# Patient Record
Sex: Female | Born: 1995 | Race: White | Hispanic: No | Marital: Single | Smoking: Current every day smoker
Health system: Southern US, Community
[De-identification: ages and names within clinical notes are randomized; demographics above are authoritative.]

## PROBLEM LIST (undated history)

## (undated) ENCOUNTER — Inpatient Hospital Stay (HOSPITAL_COMMUNITY): Payer: Self-pay

## (undated) DIAGNOSIS — R300 Dysuria: Secondary | ICD-10-CM

## (undated) DIAGNOSIS — Z789 Other specified health status: Secondary | ICD-10-CM

## (undated) DIAGNOSIS — R319 Hematuria, unspecified: Secondary | ICD-10-CM

## (undated) DIAGNOSIS — N898 Other specified noninflammatory disorders of vagina: Secondary | ICD-10-CM

## (undated) DIAGNOSIS — A749 Chlamydial infection, unspecified: Secondary | ICD-10-CM

## (undated) DIAGNOSIS — B019 Varicella without complication: Secondary | ICD-10-CM

## (undated) DIAGNOSIS — Z113 Encounter for screening for infections with a predominantly sexual mode of transmission: Secondary | ICD-10-CM

## (undated) DIAGNOSIS — IMO0002 Reserved for concepts with insufficient information to code with codable children: Secondary | ICD-10-CM

## (undated) DIAGNOSIS — F419 Anxiety disorder, unspecified: Secondary | ICD-10-CM

## (undated) DIAGNOSIS — R87629 Unspecified abnormal cytological findings in specimens from vagina: Secondary | ICD-10-CM

## (undated) HISTORY — PX: NO PAST SURGERIES: SHX2092

## (undated) HISTORY — DX: Dysuria: R30.0

## (undated) HISTORY — DX: Reserved for concepts with insufficient information to code with codable children: IMO0002

## (undated) HISTORY — DX: Unspecified abnormal cytological findings in specimens from vagina: R87.629

## (undated) HISTORY — DX: Hematuria, unspecified: R31.9

## (undated) HISTORY — DX: Varicella without complication: B01.9

## (undated) HISTORY — DX: Chlamydial infection, unspecified: A74.9

## (undated) HISTORY — DX: Anxiety disorder, unspecified: F41.9

## (undated) HISTORY — DX: Encounter for screening for infections with a predominantly sexual mode of transmission: Z11.3

## (undated) HISTORY — DX: Other specified noninflammatory disorders of vagina: N89.8

---
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
## 2017-03-06 ENCOUNTER — Encounter: Payer: Self-pay | Admitting: Advanced Practice Midwife

## 2017-03-06 ENCOUNTER — Ambulatory Visit (INDEPENDENT_AMBULATORY_CARE_PROVIDER_SITE_OTHER): Payer: Medicaid Other | Admitting: Advanced Practice Midwife

## 2017-03-06 VITALS — BP 99/78 | HR 82 | Ht 63.0 in | Wt 124.0 lb

## 2017-03-06 DIAGNOSIS — Z113 Encounter for screening for infections with a predominantly sexual mode of transmission: Secondary | ICD-10-CM

## 2017-03-06 NOTE — Progress Notes (Signed)
[facility] Visit  Patient name: Joyce Wilson MRN [id number]  Date of birth: 1995-11-18  CC & HPI:  Joyce Wilson is a 21 y.o. Caucasian female presenting today for STI testing.  Not exposed to anything, just wants to be safe.  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  Also wants therapy referral.  Has had mild depression before pregnancy, but feels like it's worse and wants to talk with a therapist . Declines SI/HI.   Pertinent History Reviewed:  Medical & Surgical Hx:   Past Medical History:  Diagnosis Date  . Burning with urination 02/01/2015  . Chicken pox   . Chlamydia infection   . Dyspareunia   . Hematuria 02/01/2015  . Screening for STD (sexually transmitted disease) 02/01/2015  . Vaginal irritation 02/01/2015   Past Surgical History:  Procedure Laterality Date  . NO PAST SURGERIES     Family History  Problem Relation Age of Onset  . Healthy Sister   . Diabetes Maternal Grandfather   . Hyperlipidemia Maternal Grandfather   . Heart disease Maternal Grandfather   . Hypertension Maternal Grandfather   . Healthy Mother   . Healthy Father   . Heart disease Maternal Uncle     Current Outpatient Prescriptions:  .  etonogestrel (NEXPLANON) 68 MG IMPL implant, 1 each by Subdermal route once., Disp: , Rfl:  Social History: Reviewed -  reports that she has quit smoking. Her smoking use included Cigarettes. She quit after 1.00 year of use. She has never used smokeless tobacco.  Review of Systems:   Constitutional: Negative for fever and chills Eyes: Negative for visual disturbances Respiratory: Negative for shortness of breath, dyspnea Cardiovascular: Negative for chest pain or palpitations  Gastrointestinal: Negative for vomiting, diarrhea and constipation; no abdominal pain Genitourinary: Negative for dysuria and urgency, vaginal irritation or itching Musculoskeletal: Negative for back pain, joint pain, myalgias  Neurological: Negative for dizziness and  headaches  Wants referral to therapist for depression. Wants to see a therapist   Objective Findings:    Physical Examination: General appearance - well appearing, and in no distress Mental status - alert, oriented to person, place, and time Chest:  Normal respiratory effort Heart - normal rate and regular rhythm Abdomen:  Soft, nontender Pelvic: deferred Musculoskeletal:  Normal range of motion without pain Extremities:  No edema    No results found for this or any previous visit (from the past 24 hour(s)).    Assessment & Plan:  A:   STI testing, depression P:  [facility]  [facility] form sent   Return for If you have any problems.  CRESENZO-DISHMAN,Sanjay Broadfoot CNM 03/06/2017 2:02 PM

## 2017-03-07 LAB — TRICHOMONAS VAGINALIS, PROBE AMP: TRICH VAG BY NAA: NEGATIVE

## 2017-03-08 LAB — GC/CHLAMYDIA PROBE AMP
CHLAMYDIA, DNA PROBE: NEGATIVE
NEISSERIA GONORRHOEAE BY PCR: NEGATIVE

## 2017-03-21 ENCOUNTER — Encounter: Payer: Self-pay | Admitting: Gastroenterology

## 2017-05-07 ENCOUNTER — Ambulatory Visit: Payer: Medicaid Other | Admitting: Gastroenterology

## 2017-11-28 ENCOUNTER — Encounter: Payer: Self-pay | Admitting: Women's Health

## 2017-11-28 ENCOUNTER — Ambulatory Visit (INDEPENDENT_AMBULATORY_CARE_PROVIDER_SITE_OTHER): Payer: Medicaid Other | Admitting: Women's Health

## 2017-11-28 ENCOUNTER — Other Ambulatory Visit: Payer: Self-pay

## 2017-11-28 VITALS — BP 118/70 | HR 93 | Ht 63.0 in | Wt 116.0 lb

## 2017-11-28 DIAGNOSIS — N921 Excessive and frequent menstruation with irregular cycle: Secondary | ICD-10-CM

## 2017-11-28 DIAGNOSIS — N898 Other specified noninflammatory disorders of vagina: Secondary | ICD-10-CM | POA: Diagnosis not present

## 2017-11-28 DIAGNOSIS — Z978 Presence of other specified devices: Secondary | ICD-10-CM

## 2017-11-28 DIAGNOSIS — L292 Pruritus vulvae: Secondary | ICD-10-CM

## 2017-11-28 DIAGNOSIS — Z975 Presence of (intrauterine) contraceptive device: Secondary | ICD-10-CM

## 2017-11-28 NOTE — Progress Notes (Signed)
   GYN VISIT Patient name: Joyce Wilson MRN [id number]  Date of birth: 1996/07/23 Chief Complaint:   Vaginal Itching (and burning)  History of Present Illness:   Joyce Wilson is a 22 y.o. G73P1001 Caucasian female being seen today for report of vulvovaginal itching and burning x 2 weeks. Had odor at first, now doesn't.  Nexplanon inserted 04/11/16, has been bleeding almost daily since, thinking about having it removed.      No LMP recorded. (Menstrual status: Irregular Periods). The current method of family planning is nexplanon Last pap <21yo. Results were:  n/a Review of Systems:   Pertinent items are noted in HPI Denies fever/chills, dizziness, headaches, visual disturbances, fatigue, shortness of breath, chest pain, abdominal pain, vomiting, abnormal vaginal discharge/itching/odor/irritation, problems with periods, bowel movements, urination, or intercourse unless otherwise stated above.  Pertinent History Reviewed:  Reviewed past medical,surgical, social, obstetrical and family history.  Reviewed problem list, medications and allergies. Physical Assessment:   Vitals:   11/28/17 1110  BP: 118/70  Pulse: 93  Weight: 116 lb (52.6 kg)  Height:  (1.6 m)  Body mass index is 20.55 kg/m.       Physical Examination:   General appearance: alert, well appearing, and in no distress  Mental status: alert, oriented to person, place, and time  Skin: warm & dry   Cardiovascular: normal heart rate noted  Respiratory: normal respiratory effort, no distress  Abdomen: soft, non-tender   Pelvic: VULVA: normal appearing vulva with no masses, tenderness or lesions, VAGINA: normal appearing vagina with normal color and scant discharge, no lesions, CERVIX: normal appearing cervix without discharge or lesions  Extremities: no edema   No results found for this or any previous visit (from the past 24 hour(s)).  Assessment & Plan:  1) Vulvovaginal itching/burning> send nuswab plus  2) BTB w/  Nexplanon> discussed if has vag infection could be from that, if not can try megace  Meds: No orders of the defined types were placed in this encounter.   Orders Placed This Encounter  Procedures  . NuSwab Vaginitis Plus (VG+)    Return for after 3/14 for , Pap & physical.  Cheral Marker CNM, [facility] 11/28/2017 11:32 AM

## 2017-12-02 ENCOUNTER — Other Ambulatory Visit: Payer: Self-pay | Admitting: Women's Health

## 2017-12-02 ENCOUNTER — Encounter: Payer: Self-pay | Admitting: Women's Health

## 2017-12-02 LAB — NUSWAB VAGINITIS PLUS (VG+)
CANDIDA ALBICANS, NAA: NEGATIVE
CHLAMYDIA TRACHOMATIS, NAA: NEGATIVE
Candida glabrata, NAA: NEGATIVE
NEISSERIA GONORRHOEAE, NAA: NEGATIVE
TRICH VAG BY NAA: NEGATIVE

## 2017-12-02 MED ORDER — MEGESTROL ACETATE 40 MG PO TABS: ORAL_TABLET | ORAL | 1 refills | Status: DC

## 2018-04-18 ENCOUNTER — Other Ambulatory Visit: Payer: Self-pay | Admitting: Women's Health

## 2018-04-18 MED ORDER — MEGESTROL ACETATE 40 MG PO TABS: ORAL_TABLET | ORAL | 1 refills | Status: DC

## 2018-12-31 ENCOUNTER — Encounter: Payer: Self-pay | Admitting: *Deleted

## 2019-01-01 ENCOUNTER — Encounter: Payer: Self-pay | Admitting: Advanced Practice Midwife

## 2019-01-01 ENCOUNTER — Ambulatory Visit (INDEPENDENT_AMBULATORY_CARE_PROVIDER_SITE_OTHER): Payer: Medicaid Other | Admitting: Advanced Practice Midwife

## 2019-01-01 ENCOUNTER — Other Ambulatory Visit: Payer: Self-pay

## 2019-01-01 VITALS — BP 118/79 | HR 101 | Ht 63.0 in | Wt 129.0 lb

## 2019-01-01 DIAGNOSIS — N898 Other specified noninflammatory disorders of vagina: Secondary | ICD-10-CM

## 2019-01-01 DIAGNOSIS — Z3046 Encounter for surveillance of implantable subdermal contraceptive: Secondary | ICD-10-CM | POA: Diagnosis not present

## 2019-01-01 NOTE — Addendum Note (Signed)
Addended by: Federico Flake A on: 01/01/2019 01:36 PM   Modules accepted: Orders

## 2019-01-01 NOTE — Progress Notes (Signed)
HPI:  Joyce Wilson 23 y.o. here for Nexplanon removal.  Her future plans for birth control are nuva ring. She has been having to wear tampons a lot d/t BTB.  C/O some mild vaginal itch/irritation. Wants to be checked for STDs.   Past Medical History: Past Medical History:  Diagnosis Date  . Burning with urination 02/01/2015  . Chicken pox   . Chlamydia infection   . Dyspareunia   . Hematuria 02/01/2015  . Screening for STD (sexually transmitted disease) 02/01/2015  . Vaginal irritation 02/01/2015    Past Surgical History: Past Surgical History:  Procedure Laterality Date  . NO PAST SURGERIES      Family History: Family History  Problem Relation Age of Onset  . Healthy Sister   . Diabetes Maternal Grandfather   . Hyperlipidemia Maternal Grandfather   . Heart disease Maternal Grandfather   . Hypertension Maternal Grandfather   . Healthy Mother   . Healthy Father   . Heart disease Maternal Uncle     Social History: Social History   Tobacco Use  . Smoking status: Former Smoker    Years: 1.00    Types: Cigarettes  . Smokeless tobacco: Never Used  Substance Use Topics  . Alcohol use: No  . Drug use: No    Allergies:  Allergies  Allergen Reactions  . Penicillins     Has patient had a PCN reaction causing immediate rash, facial/tongue/throat swelling, SOB or lightheadedness with hypotension: unknown Has patient had a PCN reaction causing severe rash involving mucus membranes or skin necrosis: unknown Has patient had a PCN reaction that required hospitalization: no Has patient had a PCN reaction occurring within the last 10 years: no If all of the above answers are "NO", then may proceed with Cephalosporin use. Pt does not know what reaction she had     Meds: (Not in a hospital admission)     Patient given informed consent for removal of her Nexplanon, time out was performed.  Signed copy in the chart.  Appropriate time out taken. Implanon site identified.  Area  prepped in usual sterile fashon. One cc of 1% lidocaine was used to anesthetize the area at the distal end of the implant. A small stab incision was made right beside the implant on the distal portion.  The Nexplanon rod was grasped using hemostats and removed without difficulty.  There was less than 3 cc blood loss. There were no complications.  A small amount of antibiotic ointment and steri-strips were applied over the small incision.  A pressure bandage was applied to reduce any bruising.  The patient tolerated the procedure well and was given post procedure instructions.    Pelvic exam: normal external genitalia, vulva, vagina, cervix, uterus and adnexa.  Discharge appears normal w/o odor  Nuswab collected.   A/P: Nexplanon out:  Use condoms/abstinence for next month Put NR in today, remove on 25th.  Continue In on 1st, out on 25th.  Will treat if infection on Nuswab Decrease tampon use.

## 2019-01-10 LAB — NUSWAB VAGINITIS PLUS (VG+)
Candida albicans, NAA: NEGATIVE
Candida glabrata, NAA: NEGATIVE
Chlamydia trachomatis, NAA: NEGATIVE
Neisseria gonorrhoeae, NAA: NEGATIVE
Trich vag by NAA: NEGATIVE

## 2019-03-31 ENCOUNTER — Encounter: Payer: Self-pay | Admitting: *Deleted

## 2019-03-31 ENCOUNTER — Other Ambulatory Visit: Payer: Self-pay | Admitting: Obstetrics and Gynecology

## 2019-03-31 DIAGNOSIS — O3680X0 Pregnancy with inconclusive fetal viability, not applicable or unspecified: Secondary | ICD-10-CM

## 2019-03-31 NOTE — Telephone Encounter (Signed)
Pt called and states her LMP was 01/22/2019.

## 2019-04-01 ENCOUNTER — Other Ambulatory Visit: Payer: Self-pay

## 2019-04-01 ENCOUNTER — Ambulatory Visit (INDEPENDENT_AMBULATORY_CARE_PROVIDER_SITE_OTHER): Payer: Medicaid Other

## 2019-04-01 DIAGNOSIS — Z3A09 9 weeks gestation of pregnancy: Secondary | ICD-10-CM

## 2019-04-01 DIAGNOSIS — O3680X0 Pregnancy with inconclusive fetal viability, not applicable or unspecified: Secondary | ICD-10-CM

## 2019-04-01 NOTE — Progress Notes (Signed)
[name] 5+2 wks,GS w/ys,no fetal pole,normal ovaries blat,pt will come back for f/u ultrasound in 10 days

## 2019-04-10 ENCOUNTER — Other Ambulatory Visit: Payer: Self-pay | Admitting: Obstetrics & Gynecology

## 2019-04-10 DIAGNOSIS — O3680X0 Pregnancy with inconclusive fetal viability, not applicable or unspecified: Secondary | ICD-10-CM

## 2019-04-13 ENCOUNTER — Ambulatory Visit (INDEPENDENT_AMBULATORY_CARE_PROVIDER_SITE_OTHER): Payer: Medicaid Other

## 2019-04-13 ENCOUNTER — Other Ambulatory Visit: Payer: Self-pay

## 2019-04-13 DIAGNOSIS — Z3A11 11 weeks gestation of pregnancy: Secondary | ICD-10-CM

## 2019-04-13 DIAGNOSIS — O3680X0 Pregnancy with inconclusive fetal viability, not applicable or unspecified: Secondary | ICD-10-CM

## 2019-04-13 NOTE — Progress Notes (Signed)
[name] 6+3 wks,single IUP w/ys,positive fht 116 bpm,normal ovaries bilat,crl 6.04 mm

## 2019-05-22 ENCOUNTER — Other Ambulatory Visit: Payer: Self-pay | Admitting: Obstetrics and Gynecology

## 2019-05-22 DIAGNOSIS — Z3682 Encounter for antenatal screening for nuchal translucency: Secondary | ICD-10-CM

## 2019-05-25 ENCOUNTER — Ambulatory Visit (INDEPENDENT_AMBULATORY_CARE_PROVIDER_SITE_OTHER): Payer: Medicaid Other | Admitting: Women's Health

## 2019-05-25 ENCOUNTER — Other Ambulatory Visit (HOSPITAL_COMMUNITY)
Admission: RE | Admit: 2019-05-25 | Discharge: 2019-05-25 | Disposition: A | Discharge status: Home / Self Care | Payer: Medicaid Other | Source: Ambulatory Visit | Attending: Obstetrics & Gynecology | Admitting: Obstetrics & Gynecology

## 2019-05-25 ENCOUNTER — Ambulatory Visit (INDEPENDENT_AMBULATORY_CARE_PROVIDER_SITE_OTHER): Payer: Medicaid Other

## 2019-05-25 ENCOUNTER — Other Ambulatory Visit: Payer: Self-pay

## 2019-05-25 ENCOUNTER — Encounter: Payer: Self-pay | Admitting: Women's Health

## 2019-05-25 ENCOUNTER — Ambulatory Visit: Payer: Medicaid Other | Admitting: *Deleted

## 2019-05-25 VITALS — BP 120/81 | HR 97 | Wt 131.0 lb

## 2019-05-25 DIAGNOSIS — Z331 Pregnant state, incidental: Secondary | ICD-10-CM

## 2019-05-25 DIAGNOSIS — Z36 Encounter for antenatal screening for chromosomal anomalies: Secondary | ICD-10-CM

## 2019-05-25 DIAGNOSIS — Z23 Encounter for immunization: Secondary | ICD-10-CM

## 2019-05-25 DIAGNOSIS — Z1379 Encounter for other screening for genetic and chromosomal anomalies: Secondary | ICD-10-CM

## 2019-05-25 DIAGNOSIS — Z124 Encounter for screening for malignant neoplasm of cervix: Secondary | ICD-10-CM | POA: Diagnosis not present

## 2019-05-25 DIAGNOSIS — Z3682 Encounter for antenatal screening for nuchal translucency: Secondary | ICD-10-CM | POA: Diagnosis not present

## 2019-05-25 DIAGNOSIS — Z130 Encounter for screening for diseases of the blood and blood-forming organs and certain disorders involving the immune mechanism: Secondary | ICD-10-CM

## 2019-05-25 DIAGNOSIS — Z0283 Encounter for blood-alcohol and blood-drug test: Secondary | ICD-10-CM

## 2019-05-25 DIAGNOSIS — Z3481 Encounter for supervision of other normal pregnancy, first trimester: Secondary | ICD-10-CM | POA: Insufficient documentation

## 2019-05-25 DIAGNOSIS — Z3A12 12 weeks gestation of pregnancy: Secondary | ICD-10-CM | POA: Diagnosis present

## 2019-05-25 DIAGNOSIS — R439 Unspecified disturbances of smell and taste: Secondary | ICD-10-CM

## 2019-05-25 DIAGNOSIS — Z1389 Encounter for screening for other disorder: Secondary | ICD-10-CM

## 2019-05-25 DIAGNOSIS — Z363 Encounter for antenatal screening for malformations: Secondary | ICD-10-CM

## 2019-05-25 DIAGNOSIS — Z3490 Encounter for supervision of normal pregnancy, unspecified, unspecified trimester: Secondary | ICD-10-CM | POA: Insufficient documentation

## 2019-05-25 DIAGNOSIS — Z1371 Encounter for nonprocreative screening for genetic disease carrier status: Secondary | ICD-10-CM

## 2019-05-25 LAB — POCT URINALYSIS DIPSTICK OB
Blood, UA: NEGATIVE
Glucose, UA: NEGATIVE
Ketones, UA: NEGATIVE
Leukocytes, UA: NEGATIVE
Nitrite, UA: NEGATIVE
POC,PROTEIN,UA: NEGATIVE

## 2019-05-25 MED ORDER — BLOOD PRESSURE MONITOR MISC: 0 refills | Status: DC

## 2019-05-25 MED ORDER — DOXYLAMINE-PYRIDOXINE 10-10 MG PO TBEC: DELAYED_RELEASE_TABLET | ORAL | 6 refills | Status: DC

## 2019-05-25 NOTE — Patient Instructions (Addendum)
Joyce Wilson, I greatly value your feedback.  If you receive a survey following your visit with [name] today, we appreciate you taking the time to fill it out.  Thanks, Joyce Wilson, CNM, [facility]  [facility] HOSPITAL HAS MOVED!!! It is now [facility] ([address], [place] [address]) Entrance located off of [address] Free 24/7 valet parking   Nausea & Vomiting  Have saltine crackers or pretzels by your bed and eat a few bites before you raise your head out of bed in the morning  Eat small frequent meals throughout the day instead of large meals  Drink plenty of fluids throughout the day to stay hydrated, just don't drink a lot of fluids with your meals.  This can make your stomach fill up faster making you feel sick  Do not brush your teeth right after you eat  Products with real ginger are good for nausea, like ginger ale and ginger hard candy Make sure it says made with real ginger!  Sucking on sour candy like lemon heads is also good for nausea  If your prenatal vitamins make you nauseated, take them at night so you will sleep through the nausea  Sea Bands  If you feel like you need medicine for the nausea & vomiting please let us know  If you are unable to keep any fluids or food down please let us know   Constipation  Drink plenty of fluid, preferably water, throughout the day  Eat foods high in fiber such as fruits, vegetables, and grains  Exercise, such as walking, is a good way to keep your bowels regular  Drink warm fluids, especially warm prune juice, or decaf coffee  Eat a 1/2 cup of real oatmeal (not instant), 1/2 cup applesauce, and 1/2-1 cup warm prune juice every day  If needed, you may take Colace (docusate sodium) stool softener once or twice a day to help keep the stool soft.   If you still are having problems with constipation, you may take Miralax once daily as needed to help keep your bowels regular.   For  Headaches:   Stay well hydrated, drink enough water so that your urine is clear, sometimes if you are dehydrated you can get headaches  Eat small frequent meals and snacks, sometimes if you are hungry you can get headaches  Sometimes you get headaches during pregnancy from the pregnancy hormones  You can try tylenol (1-2 regular strength  or 1-2 extra strength ) as directed on the box. The least amount of medication that works is best.   Cool compresses (cool wet washcloth or ice pack) to area of head that is hurting  You can also try drinking a caffeinated drink to see if this will help  If not helping, try below:  For Prevention of Headaches/Migraines:  CoQ10  three times daily  Vitamin B2  daily  Magnesium Oxide 400-600mg  daily  If You Get a Bad Headache/Migraine:  Benadryl    Magnesium Oxide  1 large Gatorade  2 extra strength Tylenol (1,000mg  total)  1 cup coffee or Coke  If this doesn't help please call us @ [phone]    Home Blood Pressure Monitoring for Patients   Your provider has recommended that you check your blood pressure (BP) at least once a week at home. If you do not have a blood pressure cuff at home, one will be provided for you. Contact your provider if you have not received  your monitor within 1 week.   Helpful Tips for Accurate Home Blood Pressure Checks   Don't smoke, exercise, or drink caffeine 30 minutes before checking your BP  Use the restroom before checking your BP (a full bladder can raise your pressure)  Relax in a comfortable upright chair  Feet on the ground  Left arm resting comfortably on a flat surface at the level of your heart  Legs uncrossed  Back supported  Sit quietly and don't talk  Place the cuff on your bare arm  Adjust snuggly, so that only two fingertips can fit between your skin and the top of the cuff  Check 2 readings separated by at least one minute  Keep a log of your BP  readings  For a visual, please reference this diagram: http://ccnc.care/bpdiagram  Provider Name: [facility]     Phone: [phone]  Zone 1: ALL CLEAR  Continue to monitor your symptoms:   BP reading is less than 140 (top number) or less than 90 (bottom number)   No right upper stomach pain  No headaches or seeing spots  No feeling nauseated or throwing up  No swelling in face and hands  Zone 2: CAUTION Call your doctor's office for any of the following:   BP reading is greater than 140 (top number) or greater than 90 (bottom number)   Stomach pain under your ribs in the middle or right side  Headaches or seeing spots  Feeling nauseated or throwing up  Swelling in face and hands  Zone 3: EMERGENCY  Seek immediate medical care if you have any of the following:   BP reading is greater than160 (top number) or greater than 110 (bottom number)  Severe headaches not improving with Tylenol  Serious difficulty catching your breath  Any worsening symptoms from Zone 2    First Trimester of Pregnancy The first trimester of pregnancy is from week 1 until the end of week 12 (months 1 through 3). A week after a sperm fertilizes an egg, the egg will implant on the wall of the uterus. This embryo will begin to develop into a baby. Genes from you and your partner are forming the baby. The female genes determine whether the baby is a boy or a girl. At 6-8 weeks, the eyes and face are formed, and the heartbeat can be seen on ultrasound. At the end of 12 weeks, all the baby's organs are formed.  Now that you are pregnant, you will want to do everything you can to have a healthy baby. Two of the most important things are to get good prenatal care and to follow your health care provider's instructions. Prenatal care is all the medical care you receive before the baby's birth. This care will help prevent, find, and treat any problems during the pregnancy and childbirth. BODY  CHANGES Your body goes through many changes during pregnancy. The changes vary from woman to woman.   You may gain or lose a couple of pounds at first.  You may feel sick to your stomach (nauseous) and throw up (vomit). If the vomiting is uncontrollable, call your health care provider.  You may tire easily.  You may develop headaches that can be relieved by medicines approved by your health care provider.  You may urinate more often. Painful urination may mean you have a bladder infection.  You may develop heartburn as a result of your pregnancy.  You may develop constipation because certain hormones are causing the muscles  that push waste through your intestines to slow down.  You may develop hemorrhoids or swollen, bulging veins (varicose veins).  Your breasts may begin to grow larger and become tender. Your nipples may stick out more, and the tissue that surrounds them (areola) may become darker.  Your gums may bleed and may be sensitive to brushing and flossing.  Dark spots or blotches (chloasma, mask of pregnancy) may develop on your face. This will likely fade after the baby is born.  Your menstrual periods will stop.  You may have a loss of appetite.  You may develop cravings for certain kinds of food.  You may have changes in your emotions from day to day, such as being excited to be pregnant or being concerned that something may go wrong with the pregnancy and baby.  You may have more vivid and strange dreams.  You may have changes in your hair. These can include thickening of your hair, rapid growth, and changes in texture. Some women also have hair loss during or after pregnancy, or hair that feels dry or thin. Your hair will most likely return to normal after your baby is born. WHAT TO EXPECT AT YOUR PRENATAL VISITS During a routine prenatal visit:  You will be weighed to make sure you and the baby are growing normally.  Your blood pressure will be taken.  Your  abdomen will be measured to track your baby's growth.  The fetal heartbeat will be listened to starting around week 10 or 12 of your pregnancy.  Test results from any previous visits will be discussed. Your health care provider may ask you:  How you are feeling.  If you are feeling the baby move.  If you have had any abnormal symptoms, such as leaking fluid, bleeding, severe headaches, or abdominal cramping.  If you have any questions. Other tests that may be performed during your first trimester include:  Blood tests to find your blood type and to check for the presence of any previous infections. They will also be used to check for low iron levels (anemia) and Rh antibodies. Later in the pregnancy, blood tests for diabetes will be done along with other tests if problems develop.  Urine tests to check for infections, diabetes, or protein in the urine.  An ultrasound to confirm the proper growth and development of the baby.  An amniocentesis to check for possible genetic problems.  Fetal screens for spina bifida and Down syndrome.  You may need other tests to make sure you and the baby are doing well. HOME CARE INSTRUCTIONS  Medicines  Follow your health care provider's instructions regarding medicine use. Specific medicines may be either safe or unsafe to take during pregnancy.  Take your prenatal vitamins as directed.  If you develop constipation, try taking a stool softener if your health care provider approves. Diet  Eat regular, well-balanced meals. Choose a variety of foods, such as meat or vegetable-based protein, fish, milk and low-fat dairy products, vegetables, fruits, and whole grain breads and cereals. Your health care provider will help you determine the amount of weight gain that is right for you.  Avoid raw meat and uncooked cheese. These carry germs that can cause birth defects in the baby.  Eating four or five small meals rather than three large meals a day  may help relieve nausea and vomiting. If you start to feel nauseous, eating a few soda crackers can be helpful. Drinking liquids between meals instead of during meals also seems  to help nausea and vomiting.  If you develop constipation, eat more high-fiber foods, such as fresh vegetables or fruit and whole grains. Drink enough fluids to keep your urine clear or pale yellow. Activity and Exercise  Exercise only as directed by your health care provider. Exercising will help you:  Control your weight.  Stay in shape.  Be prepared for labor and delivery.  Experiencing pain or cramping in the lower abdomen or low back is a good sign that you should stop exercising. Check with your health care provider before continuing normal exercises.  Try to avoid standing for long periods of time. Move your legs often if you must stand in one place for a long time.  Avoid heavy lifting.  Wear low-heeled shoes, and practice good posture.  You may continue to have sex unless your health care provider directs you otherwise. Relief of Pain or Discomfort  Wear a good support bra for breast tenderness.    Take warm sitz baths to soothe any pain or discomfort caused by hemorrhoids. Use hemorrhoid cream if your health care provider approves.    Rest with your legs elevated if you have leg cramps or low back pain.  If you develop varicose veins in your legs, wear support hose. Elevate your feet for 15 minutes, 3-4 times a day. Limit salt in your diet. Prenatal Care  Schedule your prenatal visits by the twelfth week of pregnancy. They are usually scheduled monthly at first, then more often in the last 2 months before delivery.  Write down your questions. Take them to your prenatal visits.  Keep all your prenatal visits as directed by your health care provider. Safety  Wear your seat belt at all times when driving.  Make a list of emergency phone numbers, including numbers for family, friends, the  hospital, and police and fire departments. General Tips  Ask your health care provider for a referral to a local prenatal education class. Begin classes no later than at the beginning of month 6 of your pregnancy.  Ask for help if you have counseling or nutritional needs during pregnancy. Your health care provider can offer advice or refer you to specialists for help with various needs.  Do not use hot tubs, steam rooms, or saunas.  Do not douche or use tampons or scented sanitary pads.  Do not cross your legs for long periods of time.  Avoid cat litter boxes and soil used by cats. These carry germs that can cause birth defects in the baby and possibly loss of the fetus by miscarriage or stillbirth.  Avoid all smoking, herbs, alcohol, and medicines not prescribed by your health care provider. Chemicals in these affect the formation and growth of the baby.  Schedule a dentist appointment. At home, brush your teeth with a soft toothbrush and be gentle when you floss. SEEK MEDICAL CARE IF:   You have dizziness.  You have mild pelvic cramps, pelvic pressure, or nagging pain in the abdominal area.  You have persistent nausea, vomiting, or diarrhea.  You have a bad smelling vaginal discharge.  You have pain with urination.  You notice increased swelling in your face, hands, legs, or ankles. SEEK IMMEDIATE MEDICAL CARE IF:   You have a fever.  You are leaking fluid from your vagina.  You have spotting or bleeding from your vagina.  You have severe abdominal cramping or pain.  You have rapid weight gain or loss.  You vomit blood or material that looks like coffee  grounds.  You are exposed to [place] measles and have never had them.  You are exposed to fifth disease or chickenpox.  You develop a severe headache.  You have shortness of breath.  You have any kind of trauma, such as from a fall or a car accident. Document Released: 07/10/2001 Document Revised: 11/30/2013  Document Reviewed: 05/26/2013 [facility] Patient Information 2015 [place], [place]. This information is not intended to replace advice given to you by your health care provider. Make sure you discuss any questions you have with your health care provider.  Coronavirus (COVID-19) Are you at risk?  Are you at risk for the Coronavirus (COVID-19)?  To be considered HIGH RISK for Coronavirus (COVID-19), you have to meet the following criteria:   Traveled to [place], [place], [place], [place] or [place]; or in the [place] to [place], [place], [place], or [place]; and have fever, cough, and shortness of breath within the last 2 weeks of travel OR  Been in close contact with a person diagnosed with COVID-19 within the last 2 weeks and have fever, cough, and shortness of breath  IF YOU DO NOT MEET THESE CRITERIA, YOU ARE CONSIDERED LOW RISK FOR COVID-19.  What to do if you are HIGH RISK for COVID-19?   If you are having a medical emergency, call 911.  Seek medical care right away. Before you go to a doctors office, urgent care or emergency department, call ahead and tell them about your recent travel, contact with someone diagnosed with COVID-19, and your symptoms. You should receive instructions from your physicians office regarding next steps of care.   When you arrive at healthcare provider, tell the healthcare staff immediately you have returned from visiting [place], [place], [place], [place] or [place]; or traveled in the [place] to [place], [place], [place], or [place]; in the last two weeks or you have been in close contact with a person diagnosed with COVID-19 in the last 2 weeks.    Tell the health care staff about your symptoms: fever, cough and shortness of breath.  After you have been seen by a medical provider, you will be either: o Tested for (COVID-19) and discharged home on quarantine except to seek medical care if symptoms worsen, and asked to  - Stay home  and avoid contact with others until you get your results (4-5 days)  - Avoid travel on public transportation if possible (such as bus, train, or airplane) or o Sent to the Emergency Department by EMS for evaluation, COVID-19 testing, and possible admission depending on your condition and test results.  What to do if you are LOW RISK for COVID-19?  Reduce your risk of any infection by using the same precautions used for avoiding the common cold or flu:   Wash your hands often with soap and warm water for at least 20 seconds.  If soap and water are not readily available, use an alcohol-based hand sanitizer with at least 60% alcohol.   If coughing or sneezing, cover your mouth and nose by coughing or sneezing into the elbow areas of your shirt or coat, into a tissue or into your sleeve (not your hands).  Avoid shaking hands with others and consider head nods or verbal greetings only.  Avoid touching your eyes, nose, or mouth with unwashed hands.   Avoid close contact with people who are sick.  Avoid places or events with large numbers of people in one location, like concerts or sporting events.  Carefully consider travel plans you have or are making.  If you are planning any travel outside or inside the [name], visit the [place] webpage for the latest health notices.  If you have some symptoms but not all symptoms, continue to monitor at home and seek medical attention if your symptoms worsen.  If you are having a medical emergency, call 911.   [place] / e-Visit: [email or web address]         [facility]: [facility]: [phone]                   [facility]: [phone]

## 2019-05-25 NOTE — Progress Notes (Signed)
INITIAL OBSTETRICAL VISIT Patient name: Joyce Wilson MRN [id number]  Date of birth: January 09, 1996 Chief Complaint:   Initial Prenatal Visit (nt/it, headaches)  History of Present Illness:   Joyce Wilson is a 23 y.o. G63P1001 Caucasian female at [redacted]w[redacted]d by 6wk u/s, with an Estimated Date of Delivery: 12/04/19 being seen today for her initial obstetrical visit.   Her obstetrical history is significant for term uncomplicated SVB x 1.   Today she reports n/v- requests meds, headaches.  Patient's last menstrual period was 01/22/2019 (approximate). Last pap never. Results were: n/a Review of Systems:   Pertinent items are noted in HPI Denies cramping/contractions, leakage of fluid, vaginal bleeding, abnormal vaginal discharge w/ itching/odor/irritation, headaches, visual changes, shortness of breath, chest pain, abdominal pain, severe nausea/vomiting, or problems with urination or bowel movements unless otherwise stated above.  Pertinent History Reviewed:  Reviewed past medical,surgical, social, obstetrical and family history.  Reviewed problem list, medications and allergies. OB History  Gravida Para Term Preterm AB Living  2 1 1  0 0 1  SAB TAB Ectopic Multiple Live Births  0 0 0 0 1    # Outcome Date GA Lbr Len/2nd Weight Sex Delivery Anes PTL Lv  2 Current           1 Term 01/21/16 [redacted]w[redacted]d 11:10 / 04:14 7 lb 1.2 oz (3.209 kg) F Vag-Spont EPI N LIV   Physical Assessment:   Vitals:   05/25/19 1037  BP: 120/81  Pulse: 97  Weight: 131 lb (59.4 kg)  Body mass index is 23.21 kg/m.       Physical Examination:  General appearance - well appearing, and in no distress  Mental status - alert, oriented to person, place, and time  Psych:  She has a normal mood and affect  Skin - warm and dry, normal color, no suspicious lesions noted  Chest - effort normal, all lung fields clear to auscultation bilaterally  Heart - normal rate and regular rhythm  Abdomen - soft, nontender  Extremities:  No  swelling or varicosities noted  Pelvic - VULVA: normal appearing vulva with no masses, tenderness or lesions  VAGINA: normal appearing vagina with normal color and discharge, no lesions  CERVIX: normal appearing cervix without discharge or lesions, no CMT  Thin prep pap is done w/ HR HPV cotesting  TODAY'S NT [name] 12+3 wks,measurements c/w dates,crl 67.52 mm,anterior placenta,normal ovaries bilat,NB present,NT 1.5 mm,fhr 158 bpm  Results for orders placed or performed in visit on 05/25/19 (from the past 24 hour(s))  POC Urinalysis Dipstick OB   Collection Time: 05/25/19 10:55 AM  Result Value Ref Range   Color, UA     Clarity, UA     Glucose, UA Negative Negative   Bilirubin, UA     Ketones, UA neg    Spec Grav, UA     Blood, UA neg    pH, UA     POC,PROTEIN,UA Negative Negative, Trace, Small (1+), Moderate (2+), Large (3+), 4+   Urobilinogen, UA     Nitrite, UA neg    Leukocytes, UA Negative Negative   Appearance     Odor      Assessment & Plan:  1) Low-Risk Pregnancy G2P1001 at [redacted]w[redacted]d with an Estimated Date of Delivery: 12/04/19   2) Initial OB visit  3) Headaches> gave printed prevention/relief measures   4) N/V> rx diclegis  Meds:  Meds ordered this encounter  Medications  . Blood Pressure Monitor MISC    Sig: For regular home  bp monitoring during pregnancy    Dispense:  1 each    Refill:  0    Z34.90  . Doxylamine-Pyridoxine (DICLEGIS) 10-10 MG TBEC    Sig: 2 tabs q hs, if sx persist add 1 tab q am on day 3, if sx persist add 1 tab q afternoon on day 4    Dispense:  100 tablet    Refill:  6    Order Specific Question:   Supervising Provider    Answer:   Duane Lope H [[id number]]    Initial labs obtained Continue prenatal vitamins Reviewed n/v relief measures and warning s/s to report Reviewed recommended weight gain based on pre-gravid BMI Encouraged well-balanced diet Genetic Screening discussed: requested nt/it, maternit21 Cystic fibrosis, SMA, Fragile X  screening discussed requested Ultrasound discussed; fetal survey: requested CCNC completed>PCM not here, form faxed The nature of [facility] [employer] with multiple MDs and other Advanced Practice Providers was explained to patient; also emphasized that fellows, residents, and students are part of our team. Does not have home bp cuff. Rx faxed to [facility]. Check bp weekly, let us know if >140/90.   Follow-up: Return in about 6 weeks (around 07/06/2019) for LROB, 2nd IT, MW:[id number], in person, CNM.   Orders Placed This Encounter  Procedures  . Urine Culture  . US OB Comp + 14 Wk  . Flu Vaccine QUAD 36+ mos IM  . Obstetric Panel, Including HIV  . Urinalysis, Routine w reflex microscopic  . Pain Management Screening Profile (10S)  . Sickle cell screen  . MaterniT 21 plus Core, Blood  . Integrated 1  . SMN1 COPY NUMBER ANALYSIS (SMA Carrier Screen)  . Fragile X, PCR and Southern  . POC Urinalysis Dipstick OB    Cheral Marker CNM, [facility] 05/25/2019 11:26 AM

## 2019-05-25 NOTE — Progress Notes (Signed)
[name] 12+3 wks,measurements c/w dates,crl 67.52 mm,anterior placenta,normal ovaries bilat,NB present,NT 1.5 mm,fhr 158 bpm

## 2019-05-26 LAB — URINALYSIS, ROUTINE W REFLEX MICROSCOPIC
Bilirubin, UA: NEGATIVE
Glucose, UA: NEGATIVE
Ketones, UA: NEGATIVE
Leukocytes,UA: NEGATIVE
Nitrite, UA: NEGATIVE
Protein,UA: NEGATIVE
RBC, UA: NEGATIVE
Specific Gravity, UA: 1.027 (ref 1.005–1.030)
Urobilinogen, Ur: 1 mg/dL (ref 0.2–1.0)
pH, UA: 7 (ref 5.0–7.5)

## 2019-05-26 LAB — PMP SCREEN PROFILE (10S), URINE
Amphetamine Scrn, Ur: NEGATIVE ng/mL
BARBITURATE SCREEN URINE: NEGATIVE ng/mL
BENZODIAZEPINE SCREEN, URINE: NEGATIVE ng/mL
CANNABINOIDS UR QL SCN: NEGATIVE ng/mL
Cocaine (Metab) Scrn, Ur: NEGATIVE ng/mL
Creatinine(Crt), U: 201.1 mg/dL (ref 20.0–300.0)
Methadone Screen, Urine: NEGATIVE ng/mL
OXYCODONE+OXYMORPHONE UR QL SCN: NEGATIVE ng/mL
Opiate Scrn, Ur: NEGATIVE ng/mL
Ph of Urine: 6.9 (ref 4.5–8.9)
Phencyclidine Qn, Ur: NEGATIVE ng/mL
Propoxyphene Scrn, Ur: NEGATIVE ng/mL

## 2019-05-26 LAB — OBSTETRIC PANEL, INCLUDING HIV
Antibody Screen: NEGATIVE
Basophils Absolute: 0 10*3/uL (ref 0.0–0.2)
Basos: 0 %
EOS (ABSOLUTE): 0 10*3/uL (ref 0.0–0.4)
Eos: 0 %
HIV Screen 4th Generation wRfx: NONREACTIVE
Hematocrit: 39.9 % (ref 34.0–46.6)
Hemoglobin: 13.6 g/dL (ref 11.1–15.9)
Hepatitis B Surface Ag: NEGATIVE
Immature Grans (Abs): 0 10*3/uL (ref 0.0–0.1)
Immature Granulocytes: 0 %
Lymphocytes Absolute: 1.3 10*3/uL (ref 0.7–3.1)
Lymphs: 21 %
MCH: 30.7 pg (ref 26.6–33.0)
MCHC: 34.1 g/dL (ref 31.5–35.7)
MCV: 90 fL (ref 79–97)
Monocytes Absolute: 0.6 10*3/uL (ref 0.1–0.9)
Monocytes: 9 %
Neutrophils Absolute: 4.3 10*3/uL (ref 1.4–7.0)
Neutrophils: 70 %
Platelets: 248 10*3/uL (ref 150–450)
RBC: 4.43 x10E6/uL (ref 3.77–5.28)
RDW: 12.4 % (ref 11.7–15.4)
RPR Ser Ql: NONREACTIVE
Rh Factor: POSITIVE
Rubella Antibodies, IGG: 4.77 index (ref >0.99)
WBC: 6.3 10*3/uL (ref 3.4–10.8)

## 2019-05-26 LAB — SICKLE CELL SCREEN: Sickle Cell Screen: NEGATIVE

## 2019-05-27 LAB — CYTOLOGY - PAP
Chlamydia: NEGATIVE
Comment: NEGATIVE
Comment: NORMAL
Diagnosis: NEGATIVE
Neisseria Gonorrhea: NEGATIVE

## 2019-05-27 LAB — URINE CULTURE

## 2019-06-01 LAB — MATERNIT 21 PLUS CORE, BLOOD
Fetal Fraction: 9
Result (T21): NEGATIVE
Trisomy 13 (Patau syndrome): NEGATIVE
Trisomy 18 (Edwards syndrome): NEGATIVE
Trisomy 21 (Down syndrome): NEGATIVE

## 2019-06-01 LAB — INTEGRATED 1
Crown Rump Length: 67.5 mm
Gest. Age on Collection Date: 12.9 weeks
Maternal Age at EDD: 22.1 yr
Nuchal Translucency (NT): 1.5 mm
Number of Fetuses: 1
PAPP-A Value: 591.6 ng/mL
Weight: 131 [lb_av]

## 2019-06-06 LAB — SMN1 COPY NUMBER ANALYSIS (SMA CARRIER SCREENING)

## 2019-06-06 LAB — FRAGILE X, PCR AND SOUTHERN

## 2019-07-06 ENCOUNTER — Other Ambulatory Visit: Payer: Self-pay

## 2019-07-06 ENCOUNTER — Ambulatory Visit (INDEPENDENT_AMBULATORY_CARE_PROVIDER_SITE_OTHER): Payer: Medicaid Other

## 2019-07-06 ENCOUNTER — Ambulatory Visit (INDEPENDENT_AMBULATORY_CARE_PROVIDER_SITE_OTHER): Payer: Medicaid Other | Admitting: Women's Health

## 2019-07-06 ENCOUNTER — Encounter: Payer: Self-pay | Admitting: Women's Health

## 2019-07-06 VITALS — BP 120/80 | HR 91 | Wt 131.2 lb

## 2019-07-06 DIAGNOSIS — Z1379 Encounter for other screening for genetic and chromosomal anomalies: Secondary | ICD-10-CM

## 2019-07-06 DIAGNOSIS — Z363 Encounter for antenatal screening for malformations: Secondary | ICD-10-CM | POA: Diagnosis not present

## 2019-07-06 DIAGNOSIS — Z3482 Encounter for supervision of other normal pregnancy, second trimester: Secondary | ICD-10-CM

## 2019-07-06 DIAGNOSIS — Z3A18 18 weeks gestation of pregnancy: Secondary | ICD-10-CM

## 2019-07-06 DIAGNOSIS — Z3481 Encounter for supervision of other normal pregnancy, first trimester: Secondary | ICD-10-CM

## 2019-07-06 DIAGNOSIS — Z331 Pregnant state, incidental: Secondary | ICD-10-CM

## 2019-07-06 DIAGNOSIS — Z1389 Encounter for screening for other disorder: Secondary | ICD-10-CM

## 2019-07-06 LAB — POCT URINALYSIS DIPSTICK OB
Blood, UA: NEGATIVE
Glucose, UA: NEGATIVE
Ketones, UA: NEGATIVE
Leukocytes, UA: NEGATIVE
Nitrite, UA: NEGATIVE
POC,PROTEIN,UA: NEGATIVE

## 2019-07-06 NOTE — Progress Notes (Signed)
   LOW-RISK PREGNANCY VISIT Patient name: Joyce Wilson MRN [id number]  Date of birth: [birth year] Chief Complaint:   Routine Prenatal Visit  History of Present Illness:   Joyce Wilson is a 23 y.o. G53P1001 female at [redacted]w[redacted]d with an Estimated Date of Delivery: 12/04/19 being seen today for ongoing management of a low-risk pregnancy.  Today she reports no complaints. Contractions: Not present. Vag. Bleeding: None.  Movement: Present. denies leaking of fluid. Review of Systems:   Pertinent items are noted in HPI Denies abnormal vaginal discharge w/ itching/odor/irritation, headaches, visual changes, shortness of breath, chest pain, abdominal pain, severe nausea/vomiting, or problems with urination or bowel movements unless otherwise stated above. Pertinent History Reviewed:  Reviewed past medical,surgical, social, obstetrical and family history.  Reviewed problem list, medications and allergies. Physical Assessment:   Vitals:   07/06/19 1026  BP: 120/80  Pulse: 91  Weight: 131 lb 3.2 oz (59.5 kg)  Body mass index is 23.24 kg/m.        Physical Examination:   General appearance: Well appearing, and in no distress  Mental status: Alert, oriented to person, place, and time  Skin: Warm & dry  Cardiovascular: Normal heart rate noted  Respiratory: Normal respiratory effort, no distress  Abdomen: Soft, gravid, nontender  Pelvic: Cervical exam deferred         Extremities: Edema: None  Fetal Status: Fetal Heart Rate (bpm): 150 u/s   Movement: Present    [name] 96+7 wks,cephalic,anterior placenta gr 0,normal ovaries,fhr 150 bpm,cx 3.9 cm,svp of fluid 4 cm,bilat choroid plexus cysts,right CPC 3.5 X 2.5 mm,left CPC 7.1 X 3.1 mm,EFW 255 g 63%,anatomy complete  Chaperone: n/a    Results for orders placed or performed in visit on 07/06/19 (from the past 24 hour(s))  POC Urinalysis Dipstick OB   Collection Time: 07/06/19 10:24 AM  Result Value Ref Range   Color, UA     Clarity, UA     Glucose, UA  Negative Negative   Bilirubin, UA     Ketones, UA n    Spec Grav, UA     Blood, UA n    pH, UA     POC,PROTEIN,UA Negative Negative, Trace, Small (1+), Moderate (2+), Large (3+), 4+   Urobilinogen, UA     Nitrite, UA n    Leukocytes, UA Negative Negative   Appearance     Odor      Assessment & Plan:  1) Low-risk pregnancy G2P1001 at [redacted]w[redacted]d with an Estimated Date of Delivery: 12/04/19   2) Fetal bilateral CPC, neg maternit21, discussed w/ pt and gave printed info. Lt 7.6mm, repeat u/s @ 28-32wks   Meds: No orders of the defined types were placed in this encounter.  Labs/procedures today: 2nd IT  Plan:  Continue routine obstetrical care  Next visit: prefers online    Reviewed: Preterm labor symptoms and general obstetric precautions including but not limited to vaginal bleeding, contractions, leaking of fluid and fetal movement were reviewed in detail with the patient.  All questions were answered. Has home bp cuff. Check bp weekly, let us know if >140/90.   Follow-up: Return in about 4 weeks (around 08/03/2019) for [place], [place], CNM.  Orders Placed This Encounter  Procedures  . INTEGRATED 2  . POC Urinalysis Dipstick OB   Roma Schanz CNM, [facility] 07/06/2019 11:23 AM

## 2019-07-06 NOTE — Patient Instructions (Signed)
Joyce Wilson, I greatly value your feedback.  If you receive a survey following your visit with [name] today, we appreciate you taking the time to fill it out.  Thanks, Joyce Wilson, CNM, [facility]  [place]!!! It is now [name] at [name] ([name], [name] [address]) Entrance located off of [address]   Go to [name] to register for FREE online childbirth classes  [place] Pediatricians/Family Doctors:  [name] [phone]                 [name] [phone] (usually not accepting new patients unless you have family there already, you are always welcome to call and ask)       [name] Department [phone]       [name] Pediatricians/Family Doctors:   [name]: [phone]  [name]: [phone]  [name]: [place] Doctors:   [facility]: [name]: [name]:  [name]: [phone]    Home Blood Pressure Monitoring for Patients   Your provider has recommended that you check your blood pressure (BP) at least once a week at home. If you do not have a blood pressure cuff at home, one will be provided for you. Contact your provider if you have not received your monitor within 1 week.   Helpful Tips for Accurate Home Blood Pressure Checks   Don't smoke, exercise, or drink caffeine 30 minutes before checking your BP  Use the restroom before checking your BP (a full bladder can raise your pressure)  Relax in a comfortable upright chair  Feet on the ground  Left arm resting comfortably on a flat surface at the level of your heart  Legs uncrossed  Back supported  Sit quietly and don't talk  Place the cuff on  your bare arm  Adjust snuggly, so that only two fingertips can fit between your skin and the top of the cuff  Check 2 readings separated by at least one minute  Keep a log of your BP readings  For a visual, please reference this diagram: http://ccnc.care/bpdiagram  Provider Name: [facility]     Phone: [phone]  Zone 1: ALL CLEAR  Continue to monitor your symptoms:   BP reading is less than 140 (top number) or less than 90 (bottom number)   No right upper stomach pain  No headaches or seeing spots  No feeling nauseated or throwing up  No swelling in face and hands  Zone 2: CAUTION Call your doctor's office for any of the following:   BP reading is greater than 140 (top number) or greater than 90 (bottom number)   Stomach pain under your ribs in the middle or right side  Headaches or seeing spots  Feeling nauseated or throwing up  Swelling in face and hands  Zone 3: EMERGENCY  Seek immediate medical care if you have any of the following:   BP reading is greater than160 (top number) or greater than 110 (bottom number)  Severe headaches not improving with Tylenol  Serious difficulty catching your breath  Any worsening symptoms from Zone 2     Second Trimester of Pregnancy The second trimester is from week 14 through week 27 (months 4 through 6). The second trimester is often  a time when you feel your best. Your body has adjusted to being pregnant, and you begin to feel better physically. Usually, morning sickness has lessened or quit completely, you may have more energy, and you may have an increase in appetite. The second trimester is also a time when the fetus is growing rapidly. At the end of the sixth month, the fetus is about 9 inches long and weighs about 1 pounds. You will likely begin to feel the baby move (quickening) between 16 and 20 weeks of pregnancy. Body changes during your second trimester Your body continues to go through many changes  during your second trimester. The changes vary from woman to woman.  Your weight will continue to increase. You will notice your lower abdomen bulging out.  You may begin to get stretch marks on your hips, abdomen, and breasts.  You may develop headaches that can be relieved by medicines. The medicines should be approved by your health care provider.  You may urinate more often because the fetus is pressing on your bladder.  You may develop or continue to have heartburn as a result of your pregnancy.  You may develop constipation because certain hormones are causing the muscles that push waste through your intestines to slow down.  You may develop hemorrhoids or swollen, bulging veins (varicose veins).  You may have back pain. This is caused by: ? Weight gain. ? Pregnancy hormones that are relaxing the joints in your pelvis. ? A shift in weight and the muscles that support your balance.  Your breasts will continue to grow and they will continue to become tender.  Your gums may bleed and may be sensitive to brushing and flossing.  Dark spots or blotches (chloasma, mask of pregnancy) may develop on your face. This will likely fade after the baby is born.  A dark line from your belly button to the pubic area (linea nigra) may appear. This will likely fade after the baby is born.  You may have changes in your hair. These can include thickening of your hair, rapid growth, and changes in texture. Some women also have hair loss during or after pregnancy, or hair that feels dry or thin. Your hair will most likely return to normal after your baby is born.  What to expect at prenatal visits During a routine prenatal visit:  You will be weighed to make sure you and the fetus are growing normally.  Your blood pressure will be taken.  Your abdomen will be measured to track your baby's growth.  The fetal heartbeat will be listened to.  Any test results from the previous visit will be  discussed.  Your health care provider may ask you:  How you are feeling.  If you are feeling the baby move.  If you have had any abnormal symptoms, such as leaking fluid, bleeding, severe headaches, or abdominal cramping.  If you are using any tobacco products, including cigarettes, chewing tobacco, and electronic cigarettes.  If you have any questions.  Other tests that may be performed during your second trimester include:  Blood tests that check for: ? Low iron levels (anemia). ? High blood sugar that affects pregnant women (gestational diabetes) between 66 and 28 weeks. ? Rh antibodies. This is to check for a protein on red blood cells (Rh factor).  Urine tests to check for infections, diabetes, or protein in the urine.  An ultrasound to confirm the proper growth and development of the baby.  An amniocentesis to check  for possible genetic problems.  Fetal screens for spina bifida and Down syndrome.  HIV (human immunodeficiency virus) testing. Routine prenatal testing includes screening for HIV, unless you choose not to have this test.  Follow these instructions at home: Medicines  Follow your health care provider's instructions regarding medicine use. Specific medicines may be either safe or unsafe to take during pregnancy.  Take a prenatal vitamin that contains at least 600 micrograms (mcg) of folic acid.  If you develop constipation, try taking a stool softener if your health care provider approves. Eating and drinking  Eat a balanced diet that includes fresh fruits and vegetables, whole grains, good sources of protein such as meat, eggs, or tofu, and low-fat dairy. Your health care provider will help you determine the amount of weight gain that is right for you.  Avoid raw meat and uncooked cheese. These carry germs that can cause birth defects in the baby.  If you have low calcium intake from food, talk to your health care provider about whether you should take a  daily calcium supplement.  Limit foods that are high in fat and processed sugars, such as fried and sweet foods.  To prevent constipation: ? Drink enough fluid to keep your urine clear or pale yellow. ? Eat foods that are high in fiber, such as fresh fruits and vegetables, whole grains, and beans. Activity  Exercise only as directed by your health care provider. Most women can continue their usual exercise routine during pregnancy. Try to exercise for 30 minutes at least 5 days a week. Stop exercising if you experience uterine contractions.  Avoid heavy lifting, wear low heel shoes, and practice good posture.  A sexual relationship may be continued unless your health care provider directs you otherwise. Relieving pain and discomfort  Wear a good support bra to prevent discomfort from breast tenderness.  Take warm sitz baths to soothe any pain or discomfort caused by hemorrhoids. Use hemorrhoid cream if your health care provider approves.  Rest with your legs elevated if you have leg cramps or low back pain.  If you develop varicose veins, wear support hose. Elevate your feet for 15 minutes, 3-4 times a day. Limit salt in your diet. Prenatal Care  Write down your questions. Take them to your prenatal visits.  Keep all your prenatal visits as told by your health care provider. This is important. Safety  Wear your seat belt at all times when driving.  Make a list of emergency phone numbers, including numbers for family, friends, the hospital, and police and fire departments. General instructions  Ask your health care provider for a referral to a local prenatal education class. Begin classes no later than the beginning of month 6 of your pregnancy.  Ask for help if you have counseling or nutritional needs during pregnancy. Your health care provider can offer advice or refer you to specialists for help with various needs.  Do not use hot tubs, steam rooms, or saunas.  Do not  douche or use tampons or scented sanitary pads.  Do not cross your legs for long periods of time.  Avoid cat litter boxes and soil used by cats. These carry germs that can cause birth defects in the baby and possibly loss of the fetus by miscarriage or stillbirth.  Avoid all smoking, herbs, alcohol, and unprescribed drugs. Chemicals in these products can affect the formation and growth of the baby.  Do not use any products that contain nicotine or tobacco, such as cigarettes  and e-cigarettes. If you need help quitting, ask your health care provider.  Visit your dentist if you have not gone yet during your pregnancy. Use a soft toothbrush to brush your teeth and be gentle when you floss. Contact a health care provider if:  You have dizziness.  You have mild pelvic cramps, pelvic pressure, or nagging pain in the abdominal area.  You have persistent nausea, vomiting, or diarrhea.  You have a bad smelling vaginal discharge.  You have pain when you urinate. Get help right away if:  You have a fever.  You are leaking fluid from your vagina.  You have spotting or bleeding from your vagina.  You have severe abdominal cramping or pain.  You have rapid weight gain or weight loss.  You have shortness of breath with chest pain.  You notice sudden or extreme swelling of your face, hands, ankles, feet, or legs.  You have not felt your baby move in over an hour.  You have severe headaches that do not go away when you take medicine.  You have vision changes. Summary  The second trimester is from week 14 through week 27 (months 4 through 6). It is also a time when the fetus is growing rapidly.  Your body goes through many changes during pregnancy. The changes vary from woman to woman.  Avoid all smoking, herbs, alcohol, and unprescribed drugs. These chemicals affect the formation and growth your baby.  Do not use any tobacco products, such as cigarettes, chewing tobacco, and  e-cigarettes. If you need help quitting, ask your health care provider.  Contact your health care provider if you have any questions. Keep all prenatal visits as told by your health care provider. This is important. This information is not intended to replace advice given to you by your health care provider. Make sure you discuss any questions you have with your health care provider. Document Released: 07/10/2001 Document Revised: 12/22/2015 Document Reviewed: 09/16/2012 Elsevier Interactive Patient Education  2017 [place] FLU! Because you are pregnant, we at [facility], along with the Centers for Disease Control (CDC), recommend that you receive the flu vaccine to protect yourself and your baby from the flu. The flu is more likely to cause severe illness in pregnant women than in women of reproductive age who are not pregnant. Changes in the immune system, heart, and lungs during pregnancy make pregnant women (and women up to two weeks postpartum) more prone to severe illness from flu, including illness resulting in hospitalization. Flu also may be harmful for a pregnant womans developing baby. A common flu symptom is fever, which may be associated with neural tube defects and other adverse outcomes for a developing baby. Getting vaccinated can also help protect a baby after birth from flu. (Mom passes antibodies onto the developing baby during her pregnancy.)  A Flu Vaccine is the Best Protection Against Flu Getting a flu vaccine is the first and most important step in protecting against flu. Pregnant women should get a flu shot and not the live attenuated influenza vaccine (LAIV), also known as nasal spray flu vaccine. Flu vaccines given during pregnancy help protect both the mother and her baby from flu. Vaccination has been shown to reduce the risk of flu-associated acute respiratory infection in pregnant women by up to one-half. A 2018 study showed  that getting a flu shot reduced a pregnant womans risk of being hospitalized with flu by an average of 40  percent. Pregnant women who get a flu vaccine are also helping to protect their babies from flu illness for the first several months after their birth, when they are too young to get vaccinated.   A Long Record of Safety for Flu Shots in Pregnant Women Flu shots have been given to millions of pregnant women over many years with a good safety record. There is a lot of evidence that flu vaccines can be given safely during pregnancy; though these data are limited for the first trimester. The CDC recommends that pregnant women get vaccinated during any trimester of their pregnancy. It is very important for pregnant women to get the flu shot.   Other Preventive Actions In addition to getting a flu shot, pregnant women should take the same everyday preventive actions the CDC recommends of everyone, including covering coughs, washing hands often, and avoiding people who are sick.  Symptoms and Treatment If you get sick with flu symptoms call your doctor right away. There are antiviral drugs that can treat flu illness and prevent serious flu complications. The CDC recommends prompt treatment for people who have influenza infection or suspected influenza infection and who are at high risk of serious flu complications, such as people with asthma, diabetes (including gestational diabetes), or heart disease. Early treatment of influenza in hospitalized pregnant women has been shown to reduce the length of the hospital stay.  Symptoms Flu symptoms include fever, cough, sore throat, runny or stuffy nose, body aches, headache, chills and fatigue. Some people may also have vomiting and diarrhea. People may be infected with the flu and have respiratory symptoms without a fever.  Early Treatment is Important for Pregnant Women Treatment should begin as soon as possible because antiviral drugs work best when  started early (within 48 hours after symptoms start). Antiviral drugs can make your flu illness milder and make you feel better faster. They may also prevent serious health problems that can result from flu illness. Oral oseltamivir (Tamiflu) is the preferred treatment for pregnant women because it has the most studies available to suggest that it is safe and beneficial. Antiviral drugs require a prescription from your provider. Having a fever caused by flu infection or other infections early in pregnancy may be linked to birth defects in a baby. In addition to taking antiviral drugs, pregnant women who get a fever should treat their fever with Tylenol (acetaminophen) and contact their provider immediately.  When to [place] If you are pregnant and have any of these signs, seek care immediately:  Difficulty breathing or shortness of breath  Pain or pressure in the chest or abdomen  Sudden dizziness  Confusion  Severe or persistent vomiting  High fever that is not responding to Tylenol (or store brand equivalent)  Decreased or no movement of your baby  [email or web address]

## 2019-07-06 NOTE — Progress Notes (Signed)
[name] 88+8 wks,cephalic,anterior placenta gr 0,normal ovaries,fhr 150 bpm,cx 3.9 cm,svp of fluid 4 cm,bilat choroid plexus cysts,right CPC 3.5 X 2.5 mm,left CPC 7.1 X 3.1 mm,EFW 255 g 63%,anatomy complete

## 2019-07-08 LAB — INTEGRATED 2
AFP MoM: 1.56
Alpha-Fetoprotein: 78.1 ng/mL
Crown Rump Length: 67.5 mm
DIA MoM: 0.54
DIA Value: 101.9 pg/mL
Estriol, Unconjugated: 2.23 ng/mL
Gest. Age on Collection Date: 12.9 weeks
Gestational Age: 18.9 weeks
Maternal Age at EDD: 22.1 yr
Nuchal Translucency (NT): 1.5 mm
Nuchal Translucency MoM: 0.99
Number of Fetuses: 1
PAPP-A MoM: 0.48
PAPP-A Value: 591.6 ng/mL
Test Results:: NEGATIVE
Weight: 131 [lb_av]
Weight: 131 [lb_av]
hCG MoM: 0.78
hCG Value: 19.5 IU/mL
uE3 MoM: 1.23

## 2019-07-31 NOTE — L&D Delivery Note (Signed)
OB/GYN Faculty Practice Delivery Note  Joyce Wilson is a 24 y.o. G2P1001 s/p NSVD at [redacted]w[redacted]d. She was admitted for active labor.   ROM: 1h 64m with clear fluid GBS Status: negative Maximum Maternal Temperature: 98.6*F  Labor Progress: . She was admitted in active labor. She was AROMed and progressed to complete, delivering shortly thereafter.  Delivery Date/Time: 11/28/19, 1661 Delivery: Called to room and patient was complete and pushing. Head delivered LOA. No nuchal cord present. Shoulder and body delivered in usual fashion. Infant with spontaneous cry, placed on mother's abdomen, dried and stimulated. Cord clamped x 2 after 1-minute delay, and cut by FOB under my direct supervision. Cord blood drawn. Placenta delivered spontaneously with gentle cord traction. Fundus firm with massage and Pitocin. Labia, perineum, vagina, and cervix were inspected, right labial and 1st degree tears were noted- repaired with Monocryl in the usual fashion.   Placenta: 3 vessel cord, intact, to L&D Complications: None Lacerations:  right labial and 1st degree tears were noted- repaired with Monocryl in the usual fashion. EBL: 123 mL Analgesia: epidural  Postpartum Planning [x]  message to sent to schedule follow-up  [x]  vaccines UTD  Infant: female  APGARs 8, 9  3586 g  , DO OB/GYN Fellow, Faculty Practice

## 2019-08-03 ENCOUNTER — Telehealth (INDEPENDENT_AMBULATORY_CARE_PROVIDER_SITE_OTHER): Payer: Medicaid Other | Admitting: Advanced Practice Midwife

## 2019-08-03 ENCOUNTER — Encounter: Payer: Self-pay | Admitting: Advanced Practice Midwife

## 2019-08-03 ENCOUNTER — Other Ambulatory Visit: Payer: Self-pay

## 2019-08-03 VITALS — BP 115/81 | HR 88

## 2019-08-03 DIAGNOSIS — Z3A22 22 weeks gestation of pregnancy: Secondary | ICD-10-CM

## 2019-08-03 DIAGNOSIS — O350XX0 Maternal care for (suspected) central nervous system malformation in fetus, not applicable or unspecified: Secondary | ICD-10-CM | POA: Insufficient documentation

## 2019-08-03 DIAGNOSIS — O3503X0 Maternal care for (suspected) central nervous system malformation or damage in fetus, choroid plexus cysts, not applicable or unspecified: Secondary | ICD-10-CM | POA: Insufficient documentation

## 2019-08-03 DIAGNOSIS — Z3482 Encounter for supervision of other normal pregnancy, second trimester: Secondary | ICD-10-CM

## 2019-08-03 NOTE — Progress Notes (Signed)
   TELEHEALTH VIRTUAL OBSTETRICS VISIT ENCOUNTER NOTE Patient name: Joyce Wilson MRN [id number]  Date of birth: December 29, 1995  I connected with patient on 08/03/19 at 10:10 AM EST by MyChart and verified that I am speaking with the correct person using two identifiers. Due to COVID-19 recommendations, pt is not currently in our office.    I discussed the limitations, risks, security and privacy concerns of performing an evaluation and management service by telephone and the availability of in person appointments. I also discussed with the patient that there may be a patient responsible charge related to this service. The patient expressed understanding and agreed to proceed.  Chief Complaint:   Routine Prenatal Visit (Lower back pain, "pinched nerve?")  History of Present Illness:   Joyce Wilson is a 24 y.o. G54P1001 female at [redacted]w[redacted]d with an Estimated Date of Delivery: 12/04/19 being evaluated today for ongoing management of a low-risk pregnancy.  Today she reports low back discomfort at times. Contractions: Not present. Vag. Bleeding: None.  Movement: Present. denies leaking of fluid. Review of Systems:   Pertinent items are noted in HPI Denies abnormal vaginal discharge w/ itching/odor/irritation, headaches, visual changes, shortness of breath, chest pain, abdominal pain, severe nausea/vomiting, or problems with urination or bowel movements unless otherwise stated above. Pertinent History Reviewed:  Reviewed past medical,surgical, social, obstetrical and family history.  Reviewed problem list, medications and allergies. Physical Assessment:   Vitals:   08/03/19 0959  BP: 115/81  Pulse: 88  There is no height or weight on file to calculate BMI.        Physical Examination:   General:  Alert, oriented and cooperative.   Mental Status: Normal mood and affect perceived. Normal judgment and thought content.  Rest of physical exam deferred due to type of encounter  No results found for this or  any previous visit (from the past 24 hour(s)).  Assessment & Plan:  1) Pregnancy G2P1001 at [redacted]w[redacted]d with an Estimated Date of Delivery: 12/04/19   2) Fetal bilat CPCs, will f/u on u/s at next visit   Meds: No orders of the defined types were placed in this encounter.   Labs/procedures today: none  Plan:  Continue routine obstetrical care.  Has home bp cuff.  Check bp weekly, let us know if >140/90.  Next visit:  will be in person for f/u on CPCs and PN2 > 4-5wks  Reviewed: Preterm labor symptoms and general obstetric precautions including but not limited to vaginal bleeding, contractions, leaking of fluid and fetal movement were reviewed in detail with the patient. The patient was advised to call back or seek an in-person office evaluation/go to [facility] at [facility] for any urgent or concerning symptoms. All questions were answered. Please refer to After Visit Summary for other counseling recommendations.    I provided 11 minutes of non-face-to-face time during this encounter.  Follow-up: No follow-ups on file.  Orders Placed This Encounter  Procedures  . US OB Follow Up   Arabella Merles CNM 08/03/2019 10:20 AM

## 2019-08-31 ENCOUNTER — Other Ambulatory Visit: Payer: Medicaid Other

## 2019-08-31 ENCOUNTER — Encounter: Payer: Self-pay | Admitting: Advanced Practice Midwife

## 2019-08-31 ENCOUNTER — Ambulatory Visit (INDEPENDENT_AMBULATORY_CARE_PROVIDER_SITE_OTHER): Payer: Medicaid Other | Admitting: Advanced Practice Midwife

## 2019-08-31 ENCOUNTER — Other Ambulatory Visit: Payer: Self-pay

## 2019-08-31 ENCOUNTER — Ambulatory Visit (INDEPENDENT_AMBULATORY_CARE_PROVIDER_SITE_OTHER): Payer: Medicaid Other

## 2019-08-31 VITALS — BP 129/82 | HR 101 | Wt 138.0 lb

## 2019-08-31 DIAGNOSIS — Z362 Encounter for other antenatal screening follow-up: Secondary | ICD-10-CM | POA: Diagnosis not present

## 2019-08-31 DIAGNOSIS — O350XX0 Maternal care for (suspected) central nervous system malformation in fetus, not applicable or unspecified: Secondary | ICD-10-CM

## 2019-08-31 DIAGNOSIS — Z3A26 26 weeks gestation of pregnancy: Secondary | ICD-10-CM | POA: Diagnosis not present

## 2019-08-31 DIAGNOSIS — Z331 Pregnant state, incidental: Secondary | ICD-10-CM

## 2019-08-31 DIAGNOSIS — O3503X0 Maternal care for (suspected) central nervous system malformation or damage in fetus, choroid plexus cysts, not applicable or unspecified: Secondary | ICD-10-CM

## 2019-08-31 DIAGNOSIS — Z3482 Encounter for supervision of other normal pregnancy, second trimester: Secondary | ICD-10-CM

## 2019-08-31 DIAGNOSIS — Z1389 Encounter for screening for other disorder: Secondary | ICD-10-CM

## 2019-08-31 DIAGNOSIS — Z3402 Encounter for supervision of normal first pregnancy, second trimester: Secondary | ICD-10-CM

## 2019-08-31 LAB — POCT URINALYSIS DIPSTICK OB
Blood, UA: NEGATIVE
Glucose, UA: NEGATIVE
Ketones, UA: NEGATIVE
Nitrite, UA: NEGATIVE
POC,PROTEIN,UA: NEGATIVE

## 2019-08-31 NOTE — Progress Notes (Signed)
[name] 26+3 wks,cephalic,anterior placenta gr 2,normal ovaries,svp of fluid 13.3 cm,cx length 2.8 cm,efw 971 g 49%,CPC resolved

## 2019-08-31 NOTE — Progress Notes (Signed)
   LOW-RISK PREGNANCY VISIT Patient name: Joyce Wilson MRN [id number]  Date of birth: 09-30-1995 Chief Complaint:   Routine Prenatal Visit ([name] today)  History of Present Illness:   Joyce Wilson is a 24 y.o. G68P1001 female at [redacted]w[redacted]d with an Estimated Date of Delivery: 12/04/19 being seen today for ongoing management of a low-risk pregnancy.  Today she reports some low back pain still. Contractions: Not present. Vag. Bleeding: None.  Movement: Present. denies leaking of fluid. Review of Systems:   Pertinent items are noted in HPI Denies abnormal vaginal discharge w/ itching/odor/irritation, headaches, visual changes, shortness of breath, chest pain, abdominal pain, severe nausea/vomiting, or problems with urination or bowel movements unless otherwise stated above. Pertinent History Reviewed:  Reviewed past medical,surgical, social, obstetrical and family history.  Reviewed problem list, medications and allergies. Physical Assessment:   Vitals:   08/31/19 1032  BP: 129/82  Pulse: (!) 101  Weight: 138 lb (62.6 kg)  Body mass index is 24.45 kg/m.        Physical Examination:   General appearance: Well appearing, and in no distress  Mental status: Alert, oriented to person, place, and time  Skin: Warm & dry  Cardiovascular: Normal heart rate noted  Respiratory: Normal respiratory effort, no distress  Abdomen: Soft, gravid, nontender  Pelvic: Cervical exam deferred         Extremities: Edema: None  Fetal Status: Fetal Heart Rate (bpm): 150 u/s   Movement: Present     Follow up U/S for CPCs:  [name] 26+3 wks,cephalic,anterior placenta gr 2,normal ovaries,svp of fluid 13.3 cm,cx length 2.8 cm,efw 971 g 49%,CPC resolved,fhr 150 bp  Results for orders placed or performed in visit on 08/31/19 (from the past 24 hour(s))  POC Urinalysis Dipstick OB   Collection Time: 08/31/19 10:33 AM  Result Value Ref Range   Color, UA     Clarity, UA     Glucose, UA Negative Negative   Bilirubin, UA     Ketones, UA neg    Spec Grav, UA     Blood, UA neg    pH, UA     POC,PROTEIN,UA Negative Negative, Trace, Small (1+), Moderate (2+), Large (3+), 4+   Urobilinogen, UA     Nitrite, UA neg    Leukocytes, UA Trace (A) Negative   Appearance     Odor      Assessment & Plan:  1) Low-risk pregnancy G2P1001 at [redacted]w[redacted]d with an Estimated Date of Delivery: 12/04/19   2) Bilat CPCs on anatomy U/S, resolved  3) Intermittent low back pain, 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  Labs/procedures today: PN2  Plan:  Continue routine obstetrical care   Reviewed: Preterm labor symptoms and general obstetric precautions including but not limited to vaginal bleeding, contractions, leaking of fluid and fetal movement were reviewed in detail with the patient.  All questions were answered. Has home bp cuff. Check bp weekly, let us know if >140/90.   Follow-up: Return in about 4 weeks (around 09/28/2019) for LROB, in person.  Orders Placed This Encounter  Procedures  . POC Urinalysis Dipstick OB   Joyce [facility] 08/31/2019 10:52 AM

## 2019-08-31 NOTE — Patient Instructions (Signed)
Lorinda Creed, I greatly value your feedback.  If you receive a survey following your visit with [name] today, we appreciate you taking the time to fill it out.  Thanks, Philipp Deputy, CNM  [facility] HAS MOVED!!! It is now [facility] ([address] [place], [place] [address]) Entrance located off of [address] Free 24/7 valet parking   Go to [email or web address] to register for FREE online childbirth classes  [place] Pediatricians/Family Doctors:  [facility] [phone]            [facility] [phone]                 [facility] [phone] (usually not accepting new patients unless you have family there already, you are always welcome to call and ask)       [facility] Department [phone]       [facility] Pediatricians/Family Doctors:   [name]: [phone]  [name]: [phone]  [name]: [phone]  [facility] Doctors:   [facility]: [phone]   [facility]: [phone]  [facility] Doctors:  [facility]: [phone]    Home Blood Pressure Monitoring for Patients   Your provider has recommended that you check your blood pressure (BP) at least once a week at home. If you do not have a blood pressure cuff at home, one will be provided for you. Contact your provider if you have not received your monitor within 1 week.   Helpful Tips for Accurate Home Blood Pressure Checks  . Don't smoke, exercise, or drink caffeine 30 minutes before checking your BP . Use the restroom before checking your BP (a full bladder can raise your pressure) . Relax in a comfortable upright chair . Feet on the ground . Left arm resting comfortably on a flat surface at the level of your heart . Legs uncrossed . Back supported . Sit quietly and don't talk . Place the cuff on your bare  arm . Adjust snuggly, so that only two fingertips can fit between your skin and the top of the cuff . Check 2 readings separated by at least one minute . Keep a log of your BP readings . For a visual, please reference this diagram: http://ccnc.care/bpdiagram  Provider Name: [facility]     Phone: [phone]  Zone 1: ALL CLEAR  Continue to monitor your symptoms:  . BP reading is less than 140 (top number) or less than 90 (bottom number)  . No right upper stomach pain . No headaches or seeing spots . No feeling nauseated or throwing up . No swelling in face and hands  Zone 2: CAUTION Call your doctor's office for any of the following:  . BP reading is greater than 140 (top number) or greater than 90 (bottom number)  . Stomach pain under your ribs in the middle or right side . Headaches or seeing spots . Feeling nauseated or throwing up . Swelling in face and hands  Zone 3: EMERGENCY  Seek immediate medical care if you have any of the following:  . BP reading is greater than160 (top number) or greater than 110 (bottom number) . Severe headaches not improving with Tylenol . Serious difficulty catching your breath . Any worsening symptoms from Zone 2     Second Trimester of Pregnancy The second trimester is from week 14 through week 27 (months 4 through 6). The second trimester is often a  time when you feel your best. Your body has adjusted to being pregnant, and you begin to feel better physically. Usually, morning sickness has lessened or quit completely, you may have more energy, and you may have an increase in appetite. The second trimester is also a time when the fetus is growing rapidly. At the end of the sixth month, the fetus is about 9 inches long and weighs about 1 pounds. You will likely begin to feel the baby move (quickening) between 16 and 20 weeks of pregnancy. Body changes during your second trimester Your body continues to go through many changes during your  second trimester. The changes vary from woman to woman.  Your weight will continue to increase. You will notice your lower abdomen bulging out.  You may begin to get stretch marks on your hips, abdomen, and breasts.  You may develop headaches that can be relieved by medicines. The medicines should be approved by your health care provider.  You may urinate more often because the fetus is pressing on your bladder.  You may develop or continue to have heartburn as a result of your pregnancy.  You may develop constipation because certain hormones are causing the muscles that push waste through your intestines to slow down.  You may develop hemorrhoids or swollen, bulging veins (varicose veins).  You may have back pain. This is caused by: ? Weight gain. ? Pregnancy hormones that are relaxing the joints in your pelvis. ? A shift in weight and the muscles that support your balance.  Your breasts will continue to grow and they will continue to become tender.  Your gums may bleed and may be sensitive to brushing and flossing.  Dark spots or blotches (chloasma, mask of pregnancy) may develop on your face. This will likely fade after the baby is born.  A dark line from your belly button to the pubic area (linea nigra) may appear. This will likely fade after the baby is born.  You may have changes in your hair. These can include thickening of your hair, rapid growth, and changes in texture. Some women also have hair loss during or after pregnancy, or hair that feels dry or thin. Your hair will most likely return to normal after your baby is born.  What to expect at prenatal visits During a routine prenatal visit:  You will be weighed to make sure you and the fetus are growing normally.  Your blood pressure will be taken.  Your abdomen will be measured to track your baby's growth.  The fetal heartbeat will be listened to.  Any test results from the previous visit will be  discussed.  Your health care provider may ask you:  How you are feeling.  If you are feeling the baby move.  If you have had any abnormal symptoms, such as leaking fluid, bleeding, severe headaches, or abdominal cramping.  If you are using any tobacco products, including cigarettes, chewing tobacco, and electronic cigarettes.  If you have any questions.  Other tests that may be performed during your second trimester include:  Blood tests that check for: ? Low iron levels (anemia). ? High blood sugar that affects pregnant women (gestational diabetes) between 6 and 28 weeks. ? Rh antibodies. This is to check for a protein on red blood cells (Rh factor).  Urine tests to check for infections, diabetes, or protein in the urine.  An ultrasound to confirm the proper growth and development of the baby.  An amniocentesis to check for  possible genetic problems.  Fetal screens for spina bifida and Down syndrome.  HIV (human immunodeficiency virus) testing. Routine prenatal testing includes screening for HIV, unless you choose not to have this test.  Follow these instructions at home: Medicines  Follow your health care provider's instructions regarding medicine use. Specific medicines may be either safe or unsafe to take during pregnancy.  Take a prenatal vitamin that contains at least 600 micrograms (mcg) of folic acid.  If you develop constipation, try taking a stool softener if your health care provider approves. Eating and drinking  Eat a balanced diet that includes fresh fruits and vegetables, whole grains, good sources of protein such as meat, eggs, or tofu, and low-fat dairy. Your health care provider will help you determine the amount of weight gain that is right for you.  Avoid raw meat and uncooked cheese. These carry germs that can cause birth defects in the baby.  If you have low calcium intake from food, talk to your health care provider about whether you should take a  daily calcium supplement.  Limit foods that are high in fat and processed sugars, such as fried and sweet foods.  To prevent constipation: ? Drink enough fluid to keep your urine clear or pale yellow. ? Eat foods that are high in fiber, such as fresh fruits and vegetables, whole grains, and beans. Activity  Exercise only as directed by your health care provider. Most women can continue their usual exercise routine during pregnancy. Try to exercise for 30 minutes at least 5 days a week. Stop exercising if you experience uterine contractions.  Avoid heavy lifting, wear low heel shoes, and practice good posture.  A sexual relationship may be continued unless your health care provider directs you otherwise. Relieving pain and discomfort  Wear a good support bra to prevent discomfort from breast tenderness.  Take warm sitz baths to soothe any pain or discomfort caused by hemorrhoids. Use hemorrhoid cream if your health care provider approves.  Rest with your legs elevated if you have leg cramps or low back pain.  If you develop varicose veins, wear support hose. Elevate your feet for 15 minutes, 3-4 times a day. Limit salt in your diet. Prenatal Care  Write down your questions. Take them to your prenatal visits.  Keep all your prenatal visits as told by your health care provider. This is important. Safety  Wear your seat belt at all times when driving.  Make a list of emergency phone numbers, including numbers for family, friends, the hospital, and police and fire departments. General instructions  Ask your health care provider for a referral to a local prenatal education class. Begin classes no later than the beginning of month 6 of your pregnancy.  Ask for help if you have counseling or nutritional needs during pregnancy. Your health care provider can offer advice or refer you to specialists for help with various needs.  Do not use hot tubs, steam rooms, or saunas.  Do not  douche or use tampons or scented sanitary pads.  Do not cross your legs for long periods of time.  Avoid cat litter boxes and soil used by cats. These carry germs that can cause birth defects in the baby and possibly loss of the fetus by miscarriage or stillbirth.  Avoid all smoking, herbs, alcohol, and unprescribed drugs. Chemicals in these products can affect the formation and growth of the baby.  Do not use any products that contain nicotine or tobacco, such as cigarettes and  e-cigarettes. If you need help quitting, ask your health care provider.  Visit your dentist if you have not gone yet during your pregnancy. Use a soft toothbrush to brush your teeth and be gentle when you floss. Contact a health care provider if:  You have dizziness.  You have mild pelvic cramps, pelvic pressure, or nagging pain in the abdominal area.  You have persistent nausea, vomiting, or diarrhea.  You have a bad smelling vaginal discharge.  You have pain when you urinate. Get help right away if:  You have a fever.  You are leaking fluid from your vagina.  You have spotting or bleeding from your vagina.  You have severe abdominal cramping or pain.  You have rapid weight gain or weight loss.  You have shortness of breath with chest pain.  You notice sudden or extreme swelling of your face, hands, ankles, feet, or legs.  You have not felt your baby move in over an hour.  You have severe headaches that do not go away when you take medicine.  You have vision changes. Summary  The second trimester is from week 14 through week 27 (months 4 through 6). It is also a time when the fetus is growing rapidly.  Your body goes through many changes during pregnancy. The changes vary from woman to woman.  Avoid all smoking, herbs, alcohol, and unprescribed drugs. These chemicals affect the formation and growth your baby.  Do not use any tobacco products, such as cigarettes, chewing tobacco, and  e-cigarettes. If you need help quitting, ask your health care provider.  Contact your health care provider if you have any questions. Keep all prenatal visits as told by your health care provider. This is important. This information is not intended to replace advice given to you by your health care provider. Make sure you discuss any questions you have with your health care provider. Document Released: 07/10/2001 Document Revised: 12/22/2015 Document Reviewed: 09/16/2012 Elsevier Interactive Patient Education  2017 [place] FLU! Because you are pregnant, we at [facility], along with the Centers for Disease Control (CDC), recommend that you receive the flu vaccine to protect yourself and your baby from the flu. The flu is more likely to cause severe illness in pregnant women than in women of reproductive age who are not pregnant. Changes in the immune system, heart, and lungs during pregnancy make pregnant women (and women up to two weeks postpartum) more prone to severe illness from flu, including illness resulting in hospitalization. Flu also may be harmful for a pregnant woman's developing baby. A common flu symptom is fever, which may be associated with neural tube defects and other adverse outcomes for a developing baby. Getting vaccinated can also help protect a baby after birth from flu. (Mom passes antibodies onto the developing baby during her pregnancy.)  A Flu Vaccine is the Best Protection Against Flu Getting a flu vaccine is the first and most important step in protecting against flu. Pregnant women should get a flu shot and not the live attenuated influenza vaccine (LAIV), also known as nasal spray flu vaccine. Flu vaccines given during pregnancy help protect both the mother and her baby from flu. Vaccination has been shown to reduce the risk of flu-associated acute respiratory infection in pregnant women by up to one-half. A 2018 study showed  that getting a flu shot reduced a pregnant woman's risk of being hospitalized with flu by an average of 40 percent.  Pregnant women who get a flu vaccine are also helping to protect their babies from flu illness for the first several months after their birth, when they are too young to get vaccinated.   A Long Record of Safety for Flu Shots in Pregnant Women Flu shots have been given to millions of pregnant women over many years with a good safety record. There is a lot of evidence that flu vaccines can be given safely during pregnancy; though these data are limited for the first trimester. The CDC recommends that pregnant women get vaccinated during any trimester of their pregnancy. It is very important for pregnant women to get the flu shot.   Other Preventive Actions In addition to getting a flu shot, pregnant women should take the same everyday preventive actions the CDC recommends of everyone, including covering coughs, washing hands often, and avoiding people who are sick.  Symptoms and Treatment If you get sick with flu symptoms call your doctor right away. There are antiviral drugs that can treat flu illness and prevent serious flu complications. The CDC recommends prompt treatment for people who have influenza infection or suspected influenza infection and who are at high risk of serious flu complications, such as people with asthma, diabetes (including gestational diabetes), or heart disease. Early treatment of influenza in hospitalized pregnant women has been shown to reduce the length of the hospital stay.  Symptoms Flu symptoms include fever, cough, sore throat, runny or stuffy nose, body aches, headache, chills and fatigue. Some people may also have vomiting and diarrhea. People may be infected with the flu and have respiratory symptoms without a fever.  Early Treatment is Important for Pregnant Women Treatment should begin as soon as possible because antiviral drugs work best when  started early (within 48 hours after symptoms start). Antiviral drugs can make your flu illness milder and make you feel better faster. They may also prevent serious health problems that can result from flu illness. Oral oseltamivir (Tamiflu) is the preferred treatment for pregnant women because it has the most studies available to suggest that it is safe and beneficial. Antiviral drugs require a prescription from your provider. Having a fever caused by flu infection or other infections early in pregnancy may be linked to birth defects in a baby. In addition to taking antiviral drugs, pregnant women who get a fever should treat their fever with Tylenol (acetaminophen) and contact their provider immediately.  When to [place] If you are pregnant and have any of these signs, seek care immediately:  Difficulty breathing or shortness of breath  Pain or pressure in the chest or abdomen  Sudden dizziness  Confusion  Severe or persistent vomiting  High fever that is not responding to Tylenol (or store brand equivalent)  Decreased or no movement of your baby  SolutionApps.[email or web address]

## 2019-09-01 LAB — CBC
Hematocrit: 35.7 % (ref 34.0–46.6)
Hemoglobin: 11.9 g/dL (ref 11.1–15.9)
MCH: 29.9 pg (ref 26.6–33.0)
MCHC: 33.3 g/dL (ref 31.5–35.7)
MCV: 90 fL (ref 79–97)
Platelets: 266 10*3/uL (ref 150–450)
RBC: 3.98 x10E6/uL (ref 3.77–5.28)
RDW: 12 % (ref 11.7–15.4)
WBC: 8.3 10*3/uL (ref 3.4–10.8)

## 2019-09-01 LAB — GLUCOSE TOLERANCE, 2 HOURS W/ 1HR
Glucose, 1 hour: 109 mg/dL (ref 65–179)
Glucose, 2 hour: 109 mg/dL (ref 65–152)
Glucose, Fasting: 91 mg/dL (ref 65–91)

## 2019-09-01 LAB — HIV ANTIBODY (ROUTINE TESTING W REFLEX): HIV Screen 4th Generation wRfx: NONREACTIVE

## 2019-09-01 LAB — ANTIBODY SCREEN: Antibody Screen: NEGATIVE

## 2019-09-01 LAB — RPR: RPR Ser Ql: NONREACTIVE

## 2019-09-28 ENCOUNTER — Encounter: Payer: Self-pay | Admitting: Women's Health

## 2019-09-28 ENCOUNTER — Telehealth (INDEPENDENT_AMBULATORY_CARE_PROVIDER_SITE_OTHER): Payer: Medicaid Other | Admitting: Women's Health

## 2019-09-28 VITALS — BP 112/76 | HR 83

## 2019-09-28 DIAGNOSIS — Z3483 Encounter for supervision of other normal pregnancy, third trimester: Secondary | ICD-10-CM

## 2019-09-28 NOTE — Progress Notes (Signed)
   TELEHEALTH VIRTUAL OBSTETRICS VISIT ENCOUNTER NOTE Patient name: Joyce Wilson MRN [id number]  Date of birth: [birth year]  I connected with patient on 09/28/19 at 11:30 AM EST by MyChart video  and verified that I am speaking with the correct person using two identifiers. Due to COVID-19 recommendations, pt is not currently in our office.    I discussed the limitations, risks, security and privacy concerns of performing an evaluation and management service by telephone and the availability of in person appointments. I also discussed with the patient that there may be a patient responsible charge related to this service. The patient expressed understanding and agreed to proceed.  Chief Complaint:   Routine Prenatal Visit  History of Present Illness:   Joyce Wilson is a 24 y.o. G40P1001 female at [redacted]w[redacted]d with an Estimated Date of Delivery: 12/04/19 being evaluated today for ongoing management of a low-risk pregnancy.  Today she reports some yellow d/c w/ cramping 2days last week, resolved on its own, no itching/irritation.. Contractions: Not present. Vag. Bleeding: None.  Movement: Present. denies leaking of fluid. Review of Systems:   Pertinent items are noted in HPI Denies abnormal vaginal discharge w/ itching/odor/irritation, headaches, visual changes, shortness of breath, chest pain, abdominal pain, severe nausea/vomiting, or problems with urination or bowel movements unless otherwise stated above. Pertinent History Reviewed:  Reviewed past medical,surgical, social, obstetrical and family history.  Reviewed problem list, medications and allergies. Physical Assessment:   Vitals:   09/28/19 1120  BP: 112/76  Pulse: 83  There is no height or weight on file to calculate BMI.        Physical Examination:   General:  Alert, oriented and cooperative.   Mental Status: Normal mood and affect perceived. Normal judgment and thought content.  Rest of physical exam deferred due to type of  encounter  No results found for this or any previous visit (from the past 24 hour(s)).  Assessment & Plan:  1) Pregnancy G2P1001 at [redacted]w[redacted]d with an Estimated Date of Delivery: 12/04/19   2) Resolved vaginal d/c, cramping, reviewed warning s/s to report   Meds: No orders of the defined types were placed in this encounter.  Labs/procedures today: none  Plan:  Continue routine obstetrical care.  Has home bp cuff.  Check bp weekly, let us know if >140/90.  Next visit: prefers online    Reviewed: Preterm labor symptoms and general obstetric precautions including but not limited to vaginal bleeding, contractions, leaking of fluid and fetal movement were reviewed in detail with the patient. The patient was advised to call back or seek an in-person office evaluation/go to [facility] at [facility] for any urgent or concerning symptoms. All questions were answered. Please refer to After Visit Summary for other counseling recommendations.    I provided 15 minutes of non-face-to-face time during this encounter.  Follow-up: Return in about 2 weeks (around 10/12/2019) for LROB, MyChart Video, CNM.  No orders of the defined types were placed in this encounter.  Cheral Marker CNM, [facility] 09/28/2019 11:41 AM

## 2019-09-28 NOTE — Patient Instructions (Signed)
Joyce Wilson, I greatly value your feedback.  If you receive a survey following your visit with us today, we appreciate you taking the time to fill it out.  Thanks, Kim Zeke Aker, CNM, WHNP-BC  [facility]!!! It is now [facility] ([address] [place], [place] [address]) Entrance located off of [address] Free 24/7 valet parking    Go to [email or web address] to register for FREE online childbirth classes   Call the office ([phone]) or go to [facility] if:  You begin to have strong, frequent contractions  Your water breaks.  Sometimes it is a big gush of fluid, sometimes it is just a trickle that keeps getting your panties wet or running down your legs  You have vaginal bleeding.  It is normal to have a small amount of spotting if your cervix was checked.   You don't feel your baby moving like normal.  If you don't, get you something to eat and drink and lay down and focus on feeling your baby move.  You should feel at least 10 movements in 2 hours.  If you don't, you should call the office or go to [facility].    Tdap Vaccine  It is recommended that you get the Tdap vaccine during the third trimester of EACH pregnancy to help protect your baby from getting pertussis (whooping cough)  27-36 weeks is the BEST time to do this so that you can pass the protection on to your baby. During pregnancy is better than after pregnancy, but if you are unable to get it during pregnancy it will be offered at the hospital.   You can get this vaccine with us, at the health department, your family doctor, or some local pharmacies  Everyone who will be around your baby should also be up-to-date on their vaccines before the baby comes. Adults (who are not pregnant) only need 1 dose of Tdap during adulthood.   [place] Pediatricians/Family Doctors:  [facility] [phone]            [facility] [phone]                  [facility] [phone] (usually not accepting new patients unless you have family there already, you are always welcome to call and ask)       [facility] [phone]       [place] Pediatricians/Family Doctors:   [name]: [phone]  [name]: [phone]  [name]: [phone]  [place] Family Doctors:   [facility]: [phone]   [facility]: [phone]  [place] Family Doctors:  [facility]: [phone]   Home Blood Pressure Monitoring for Patients   Your provider has recommended that you check your blood pressure (BP) at least once a week at home. If you do not have a blood pressure cuff at home, one will be provided for you. Contact your provider if you have not received your monitor within 1 week.   Helpful Tips for Accurate Home Blood Pressure Checks  . Don't smoke, exercise, or drink caffeine 30 minutes before checking your BP . Use the restroom before checking your BP (a full bladder can raise your pressure) . Relax in a comfortable upright chair . Feet on the ground . Left arm resting comfortably on a flat surface at the level of your heart . Legs uncrossed . Back supported . Sit quietly and don't talk .   Place the cuff on your bare arm . Adjust snuggly, so that only two fingertips can fit between your skin and the top of the cuff . Check 2 readings separated by at least one minute . Keep a log of your BP readings . For a visual, please reference this diagram: http://ccnc.care/bpdiagram  Provider Name: [facility]     Phone: [phone]  Zone 1: ALL CLEAR  Continue to monitor your symptoms:  . BP reading is less than 140 (top number) or less than 90 (bottom number)  . No right upper stomach pain . No headaches or seeing spots . No feeling nauseated or throwing up . No swelling in face and  hands  Zone 2: CAUTION Call your doctor's office for any of the following:  . BP reading is greater than 140 (top number) or greater than 90 (bottom number)  . Stomach pain under your ribs in the middle or right side . Headaches or seeing spots . Feeling nauseated or throwing up . Swelling in face and hands  Zone 3: EMERGENCY  Seek immediate medical care if you have any of the following:  . BP reading is greater than160 (top number) or greater than 110 (bottom number) . Severe headaches not improving with Tylenol . Serious difficulty catching your breath . Any worsening symptoms from Zone 2   Third Trimester of Pregnancy The third trimester is from week 29 through week 42, months 7 through 9. The third trimester is a time when the fetus is growing rapidly. At the end of the ninth month, the fetus is about 20 inches in length and weighs 6-10 pounds.  BODY CHANGES Your body goes through many changes during pregnancy. The changes vary from woman to woman.   Your weight will continue to increase. You can expect to gain 25-35 pounds (11-16 kg) by the end of the pregnancy.  You may begin to get stretch marks on your hips, abdomen, and breasts.  You may urinate more often because the fetus is moving lower into your pelvis and pressing on your bladder.  You may develop or continue to have heartburn as a result of your pregnancy.  You may develop constipation because certain hormones are causing the muscles that push waste through your intestines to slow down.  You may develop hemorrhoids or swollen, bulging veins (varicose veins).  You may have pelvic pain because of the weight gain and pregnancy hormones relaxing your joints between the bones in your pelvis. Backaches may result from overexertion of the muscles supporting your posture.  You may have changes in your hair. These can include thickening of your hair, rapid growth, and changes in texture. Some women also have hair loss during  or after pregnancy, or hair that feels dry or thin. Your hair will most likely return to normal after your baby is born.  Your breasts will continue to grow and be tender. A yellow discharge may leak from your breasts called colostrum.  Your belly button may stick out.  You may feel short of breath because of your expanding uterus.  You may notice the fetus "dropping," or moving lower in your abdomen.  You may have a bloody mucus discharge. This usually occurs a few days to a week before labor begins.  Your cervix becomes thin and soft (effaced) near your due date. WHAT TO EXPECT AT YOUR PRENATAL EXAMS  You will have prenatal exams every 2 weeks until week 36. Then, you will have weekly prenatal exams. During  a routine prenatal visit:  You will be weighed to make sure you and the fetus are growing normally.  Your blood pressure is taken.  Your abdomen will be measured to track your baby's growth.  The fetal heartbeat will be listened to.  Any test results from the previous visit will be discussed.  You may have a cervical check near your due date to see if you have effaced. At around 36 weeks, your caregiver will check your cervix. At the same time, your caregiver will also perform a test on the secretions of the vaginal tissue. This test is to determine if a type of bacteria, Group B streptococcus, is present. Your caregiver will explain this further. Your caregiver may ask you:  What your birth plan is.  How you are feeling.  If you are feeling the baby move.  If you have had any abnormal symptoms, such as leaking fluid, bleeding, severe headaches, or abdominal cramping.  If you have any questions. Other tests or screenings that may be performed during your third trimester include:  Blood tests that check for low iron levels (anemia).  Fetal testing to check the health, activity level, and growth of the fetus. Testing is done if you have certain medical conditions or if  there are problems during the pregnancy. FALSE LABOR You may feel small, irregular contractions that eventually go away. These are called Braxton Hicks contractions, or false labor. Contractions may last for hours, days, or even weeks before true labor sets in. If contractions come at regular intervals, intensify, or become painful, it is best to be seen by your caregiver.  SIGNS OF LABOR   Menstrual-like cramps.  Contractions that are 5 minutes apart or less.  Contractions that start on the top of the uterus and spread down to the lower abdomen and back.  A sense of increased pelvic pressure or back pain.  A watery or bloody mucus discharge that comes from the vagina. If you have any of these signs before the 37th week of pregnancy, call your caregiver right away. You need to go to the hospital to get checked immediately. HOME CARE INSTRUCTIONS   Avoid all smoking, herbs, alcohol, and unprescribed drugs. These chemicals affect the formation and growth of the baby.  Follow your caregiver's instructions regarding medicine use. There are medicines that are either safe or unsafe to take during pregnancy.  Exercise only as directed by your caregiver. Experiencing uterine cramps is a good sign to stop exercising.  Continue to eat regular, healthy meals.  Wear a good support bra for breast tenderness.  Do not use hot tubs, steam rooms, or saunas.  Wear your seat belt at all times when driving.  Avoid raw meat, uncooked cheese, cat litter boxes, and soil used by cats. These carry germs that can cause birth defects in the baby.  Take your prenatal vitamins.  Try taking a stool softener (if your caregiver approves) if you develop constipation. Eat more high-fiber foods, such as fresh vegetables or fruit and whole grains. Drink plenty of fluids to keep your urine clear or pale yellow.  Take warm sitz baths to soothe any pain or discomfort caused by hemorrhoids. Use hemorrhoid cream if your  caregiver approves.  If you develop varicose veins, wear support hose. Elevate your feet for 15 minutes, 3-4 times a day. Limit salt in your diet.  Avoid heavy lifting, wear low heal shoes, and practice good posture.  Rest a lot with your legs elevated if you  have leg cramps or low back pain.  Visit your dentist if you have not gone during your pregnancy. Use a soft toothbrush to brush your teeth and be gentle when you floss.  A sexual relationship may be continued unless your caregiver directs you otherwise.  Do not travel far distances unless it is absolutely necessary and only with the approval of your caregiver.  Take prenatal classes to understand, practice, and ask questions about the labor and delivery.  Make a trial run to the hospital.  Pack your hospital bag.  Prepare the baby's nursery.  Continue to go to all your prenatal visits as directed by your caregiver. SEEK MEDICAL CARE IF:  You are unsure if you are in labor or if your water has broken.  You have dizziness.  You have mild pelvic cramps, pelvic pressure, or nagging pain in your abdominal area.  You have persistent nausea, vomiting, or diarrhea.  You have a bad smelling vaginal discharge.  You have pain with urination. SEEK IMMEDIATE MEDICAL CARE IF:   You have a fever.  You are leaking fluid from your vagina.  You have spotting or bleeding from your vagina.  You have severe abdominal cramping or pain.  You have rapid weight loss or gain.  You have shortness of breath with chest pain.  You notice sudden or extreme swelling of your face, hands, ankles, feet, or legs.  You have not felt your baby move in over an hour.  You have severe headaches that do not go away with medicine.  You have vision changes. Document Released: 07/10/2001 Document Revised: 07/21/2013 Document Reviewed: 09/16/2012 [facility] Patient Information 2015 [place], [place]. This information is not intended to replace advice  given to you by your health care provider. Make sure you discuss any questions you have with your health care provider.  PROTECT YOURSELF & YOUR BABY FROM THE FLU! Because you are pregnant, we at [facility], along with the Centers for Disease Control (CDC), recommend that you receive the flu vaccine to protect yourself and your baby from the flu. The flu is more likely to cause severe illness in pregnant women than in women of reproductive age who are not pregnant. Changes in the immune system, heart, and lungs during pregnancy make pregnant women (and women up to two weeks postpartum) more prone to severe illness from flu, including illness resulting in hospitalization. Flu also may be harmful for a pregnant woman's developing baby. A common flu symptom is fever, which may be associated with neural tube defects and other adverse outcomes for a developing baby. Getting vaccinated can also help protect a baby after birth from flu. (Mom passes antibodies onto the developing baby during her pregnancy.)  A Flu Vaccine is the Best Protection Against Flu Getting a flu vaccine is the first and most important step in protecting against flu. Pregnant women should get a flu shot and not the live attenuated influenza vaccine (LAIV), also known as nasal spray flu vaccine. Flu vaccines given during pregnancy help protect both the mother and her baby from flu. Vaccination has been shown to reduce the risk of flu-associated acute respiratory infection in pregnant women by up to one-half. A 2018 study showed that getting a flu shot reduced a pregnant woman's risk of being hospitalized with flu by an average of 40 percent. Pregnant women who get a flu vaccine are also helping to protect their babies from flu illness for the first several months after their birth, when they are too  young to get vaccinated.   A Long Record of Safety for Flu Shots in Pregnant Women Flu shots have been given to millions of pregnant women over  many years with a good safety record. There is a lot of evidence that flu vaccines can be given safely during pregnancy; though these data are limited for the first trimester. The CDC recommends that pregnant women get vaccinated during any trimester of their pregnancy. It is very important for pregnant women to get the flu shot.   Other Preventive Actions In addition to getting a flu shot, pregnant women should take the same everyday preventive actions the CDC recommends of everyone, including covering coughs, washing hands often, and avoiding people who are sick.  Symptoms and Treatment If you get sick with flu symptoms call your doctor right away. There are antiviral drugs that can treat flu illness and prevent serious flu complications. The CDC recommends prompt treatment for people who have influenza infection or suspected influenza infection and who are at high risk of serious flu complications, such as people with asthma, diabetes (including gestational diabetes), or heart disease. Early treatment of influenza in hospitalized pregnant women has been shown to reduce the length of the hospital stay.  Symptoms Flu symptoms include fever, cough, sore throat, runny or stuffy nose, body aches, headache, chills and fatigue. Some people may also have vomiting and diarrhea. People may be infected with the flu and have respiratory symptoms without a fever.  Early Treatment is Important for Pregnant Women Treatment should begin as soon as possible because antiviral drugs work best when started early (within 48 hours after symptoms start). Antiviral drugs can make your flu illness milder and make you feel better faster. They may also prevent serious health problems that can result from flu illness. Oral oseltamivir (Tamiflu) is the preferred treatment for pregnant women because it has the most studies available to suggest that it is safe and beneficial. Antiviral drugs require a prescription from your  provider. Having a fever caused by flu infection or other infections early in pregnancy may be linked to birth defects in a baby. In addition to taking antiviral drugs, pregnant women who get a fever should treat their fever with Tylenol (acetaminophen) and contact their provider immediately.  When to [place] If you are pregnant and have any of these signs, seek care immediately:  Difficulty breathing or shortness of breath  Pain or pressure in the chest or abdomen  Sudden dizziness  Confusion  Severe or persistent vomiting  High fever that is not responding to Tylenol (or store brand equivalent)  Decreased or no movement of your baby  [email or web address]

## 2019-10-12 ENCOUNTER — Encounter: Payer: Self-pay | Admitting: Women's Health

## 2019-10-12 ENCOUNTER — Telehealth (INDEPENDENT_AMBULATORY_CARE_PROVIDER_SITE_OTHER): Payer: Medicaid Other | Admitting: Women's Health

## 2019-10-12 ENCOUNTER — Other Ambulatory Visit: Payer: Self-pay

## 2019-10-12 VITALS — BP 115/80 | HR 92

## 2019-10-12 DIAGNOSIS — O350XX0 Maternal care for (suspected) central nervous system malformation in fetus, not applicable or unspecified: Secondary | ICD-10-CM

## 2019-10-12 DIAGNOSIS — Z3483 Encounter for supervision of other normal pregnancy, third trimester: Secondary | ICD-10-CM

## 2019-10-12 DIAGNOSIS — Z3A32 32 weeks gestation of pregnancy: Secondary | ICD-10-CM

## 2019-10-12 DIAGNOSIS — O3503X0 Maternal care for (suspected) central nervous system malformation or damage in fetus, choroid plexus cysts, not applicable or unspecified: Secondary | ICD-10-CM

## 2019-10-12 NOTE — Progress Notes (Signed)
   TELEHEALTH VIRTUAL OBSTETRICS VISIT ENCOUNTER NOTE Patient name: Joyce Wilson MRN [id number]  Date of birth: 08-29-1995  I connected with patient on 10/12/19 at 11:30 AM EDT by MyChart video  and verified that I am speaking with the correct person using two identifiers. Due to COVID-19 recommendations, pt is not currently in our office.    I discussed the limitations, risks, security and privacy concerns of performing an evaluation and management service by telephone and the availability of in person appointments. I also discussed with the patient that there may be a patient responsible charge related to this service. The patient expressed understanding and agreed to proceed.  Chief Complaint:   Routine Prenatal Visit  History of Present Illness:   Joyce Wilson is a 24 y.o. G83P1001 female at [redacted]w[redacted]d with an Estimated Date of Delivery: 12/04/19 being evaluated today for ongoing management of a low-risk pregnancy.  Today she reports tested +for Covid 3/2, thought she had sinus infection then lost smell. Feeling better now, going back to work this week. Contractions: Not present. Vag. Bleeding: None.  Movement: Present. denies leaking of fluid. Review of Systems:   Pertinent items are noted in HPI Denies abnormal vaginal discharge w/ itching/odor/irritation, headaches, visual changes, shortness of breath, chest pain, abdominal pain, severe nausea/vomiting, or problems with urination or bowel movements unless otherwise stated above. Pertinent History Reviewed:  Reviewed past medical,surgical, social, obstetrical and family history.  Reviewed problem list, medications and allergies. Physical Assessment:   Vitals:   10/12/19 1125  BP: 115/80  Pulse: 92  There is no height or weight on file to calculate BMI.        Physical Examination:   General:  Alert, oriented and cooperative.   Mental Status: Normal mood and affect perceived. Normal judgment and thought content.  Rest of physical exam  deferred due to type of encounter  No results found for this or any previous visit (from the past 24 hour(s)).  Assessment & Plan:  1) Pregnancy G2P1001 at [redacted]w[redacted]d with an Estimated Date of Delivery: 12/04/19    Meds: No orders of the defined types were placed in this encounter.   Labs/procedures today: none  Plan:  Continue routine obstetrical care.  Has home bp cuff.  Check bp weekly, let us know if >140/90.  Next visit: prefers online    Reviewed: Preterm labor symptoms and general obstetric precautions including but not limited to vaginal bleeding, contractions, leaking of fluid and fetal movement were reviewed in detail with the patient. The patient was advised to call back or seek an in-person office evaluation/go to [facility] at [facility] for any urgent or concerning symptoms. All questions were answered. Please refer to After Visit Summary for other counseling recommendations.    I provided 15 minutes of non-face-to-face time during this encounter.  Follow-up: Return in about 2 weeks (around 10/26/2019) for LROB, MyChart Video, CNM.  No orders of the defined types were placed in this encounter.  Cheral Marker CNM, [facility] 10/12/2019 11:36 AM

## 2019-10-12 NOTE — Patient Instructions (Signed)
Joyce Wilson, I greatly value your feedback.  If you receive a survey following your visit with [name] today, we appreciate you taking the time to fill it out.  Thanks, Joyce Wilson, CNM, [facility]  [facility] HOSPITAL HAS MOVED!!! It is now [facility] ([address] [place], [place] [address]) Entrance located off of [address] Free 24/7 valet parking    Go to [email or web address] to register for FREE online childbirth classes   Call the office [phone]) or go to [facility] if:  You begin to have strong, frequent contractions  Your water breaks.  Sometimes it is a big gush of fluid, sometimes it is just a trickle that keeps getting your panties wet or running down your legs  You have vaginal bleeding.  It is normal to have a small amount of spotting if your cervix was checked.   You don't feel your baby moving like normal.  If you don't, get you something to eat and drink and lay down and focus on feeling your baby move.  You should feel at least 10 movements in 2 hours.  If you don't, you should call the office or go to [facility].    Tdap Vaccine  It is recommended that you get the Tdap vaccine during the third trimester of EACH pregnancy to help protect your baby from getting pertussis (whooping cough)  27-36 weeks is the BEST time to do this so that you can pass the protection on to your baby. During pregnancy is better than after pregnancy, but if you are unable to get it during pregnancy it will be offered at the hospital.   You can get this vaccine with [name], at the health department, your family doctor, or some local pharmacies  Everyone who will be around your baby should also be up-to-date on their vaccines before the baby comes. Adults (who are not pregnant) only need 1 dose of Tdap during adulthood.   [place] Pediatricians/Family Doctors:  [facility] [phone]            [facility] [phone]                  [facility] [phone] (usually not accepting new patients unless you have family there already, you are always welcome to call and ask)       [facility] Department [phone]       [facility] Pediatricians/Family Doctors:   [name]: [phone]  [name]: [phone]  [name]: [phone]  [facility] Doctors:   [facility]: [phone]   [facility]: [phone]  [facility] Doctors:  [facility]: [phone]   Home Blood Pressure Monitoring for Patients   Your provider has recommended that you check your blood pressure (BP) at least once a week at home. If you do not have a blood pressure cuff at home, one will be provided for you. Contact your provider if you have not received your monitor within 1 week.   Helpful Tips for Accurate Home Blood Pressure Checks  . Don't smoke, exercise, or drink caffeine 30 minutes before checking your BP . Use the restroom before checking your BP (a full bladder can raise your pressure) . Relax in a comfortable upright chair . Feet on the ground . Left arm resting comfortably on a flat surface at the level of your heart . Legs uncrossed . Back supported . Sit quietly and don't talk .  Place the cuff on your bare arm . Adjust snuggly, so that only two fingertips can fit between your skin and the top of the cuff . Check 2 readings separated by at least one minute . Keep a log of your BP readings . For a visual, please reference this diagram: http://ccnc.care/bpdiagram  Provider Name: [facility]     Phone: [phone]  Zone 1: ALL CLEAR  Continue to monitor your symptoms:  . BP reading is less than 140 (top number) or less than 90 (bottom number)  . No right upper stomach pain . No headaches or seeing spots . No feeling nauseated or throwing up . No swelling in face and  hands  Zone 2: CAUTION Call your doctor's office for any of the following:  . BP reading is greater than 140 (top number) or greater than 90 (bottom number)  . Stomach pain under your ribs in the middle or right side . Headaches or seeing spots . Feeling nauseated or throwing up . Swelling in face and hands  Zone 3: EMERGENCY  Seek immediate medical care if you have any of the following:  . BP reading is greater than160 (top number) or greater than 110 (bottom number) . Severe headaches not improving with Tylenol . Serious difficulty catching your breath . Any worsening symptoms from Zone 2   Third Trimester of Pregnancy The third trimester is from week 29 through week 42, months 7 through 9. The third trimester is a time when the fetus is growing rapidly. At the end of the ninth month, the fetus is about 20 inches in length and weighs 6-10 pounds.  BODY CHANGES Your body goes through many changes during pregnancy. The changes vary from woman to woman.   Your weight will continue to increase. You can expect to gain 25-35 pounds (11-16 kg) by the end of the pregnancy.  You may begin to get stretch marks on your hips, abdomen, and breasts.  You may urinate more often because the fetus is moving lower into your pelvis and pressing on your bladder.  You may develop or continue to have heartburn as a result of your pregnancy.  You may develop constipation because certain hormones are causing the muscles that push waste through your intestines to slow down.  You may develop hemorrhoids or swollen, bulging veins (varicose veins).  You may have pelvic pain because of the weight gain and pregnancy hormones relaxing your joints between the bones in your pelvis. Backaches may result from overexertion of the muscles supporting your posture.  You may have changes in your hair. These can include thickening of your hair, rapid growth, and changes in texture. Some women also have hair loss during  or after pregnancy, or hair that feels dry or thin. Your hair will most likely return to normal after your baby is born.  Your breasts will continue to grow and be tender. A yellow discharge may leak from your breasts called colostrum.  Your belly button may stick out.  You may feel short of breath because of your expanding uterus.  You may notice the fetus "dropping," or moving lower in your abdomen.  You may have a bloody mucus discharge. This usually occurs a few days to a week before labor begins.  Your cervix becomes thin and soft (effaced) near your due date. WHAT TO EXPECT AT YOUR PRENATAL EXAMS  You will have prenatal exams every 2 weeks until week 36. Then, you will have weekly prenatal exams. During  a routine prenatal visit:  You will be weighed to make sure you and the fetus are growing normally.  Your blood pressure is taken.  Your abdomen will be measured to track your baby's growth.  The fetal heartbeat will be listened to.  Any test results from the previous visit will be discussed.  You may have a cervical check near your due date to see if you have effaced. At around 36 weeks, your caregiver will check your cervix. At the same time, your caregiver will also perform a test on the secretions of the vaginal tissue. This test is to determine if a type of bacteria, Group B streptococcus, is present. Your caregiver will explain this further. Your caregiver may ask you:  What your birth plan is.  How you are feeling.  If you are feeling the baby move.  If you have had any abnormal symptoms, such as leaking fluid, bleeding, severe headaches, or abdominal cramping.  If you have any questions. Other tests or screenings that may be performed during your third trimester include:  Blood tests that check for low iron levels (anemia).  Fetal testing to check the health, activity level, and growth of the fetus. Testing is done if you have certain medical conditions or if  there are problems during the pregnancy. FALSE LABOR You may feel small, irregular contractions that eventually go away. These are called Braxton Hicks contractions, or false labor. Contractions may last for hours, days, or even weeks before true labor sets in. If contractions come at regular intervals, intensify, or become painful, it is best to be seen by your caregiver.  SIGNS OF LABOR   Menstrual-like cramps.  Contractions that are 5 minutes apart or less.  Contractions that start on the top of the uterus and spread down to the lower abdomen and back.  A sense of increased pelvic pressure or back pain.  A watery or bloody mucus discharge that comes from the vagina. If you have any of these signs before the 37th week of pregnancy, call your caregiver right away. You need to go to the hospital to get checked immediately. HOME CARE INSTRUCTIONS   Avoid all smoking, herbs, alcohol, and unprescribed drugs. These chemicals affect the formation and growth of the baby.  Follow your caregiver's instructions regarding medicine use. There are medicines that are either safe or unsafe to take during pregnancy.  Exercise only as directed by your caregiver. Experiencing uterine cramps is a good sign to stop exercising.  Continue to eat regular, healthy meals.  Wear a good support bra for breast tenderness.  Do not use hot tubs, steam rooms, or saunas.  Wear your seat belt at all times when driving.  Avoid raw meat, uncooked cheese, cat litter boxes, and soil used by cats. These carry germs that can cause birth defects in the baby.  Take your prenatal vitamins.  Try taking a stool softener (if your caregiver approves) if you develop constipation. Eat more high-fiber foods, such as fresh vegetables or fruit and whole grains. Drink plenty of fluids to keep your urine clear or pale yellow.  Take warm sitz baths to soothe any pain or discomfort caused by hemorrhoids. Use hemorrhoid cream if your  caregiver approves.  If you develop varicose veins, wear support hose. Elevate your feet for 15 minutes, 3-4 times a day. Limit salt in your diet.  Avoid heavy lifting, wear low heal shoes, and practice good posture.  Rest a lot with your legs elevated if you  have leg cramps or low back pain.  Visit your dentist if you have not gone during your pregnancy. Use a soft toothbrush to brush your teeth and be gentle when you floss.  A sexual relationship may be continued unless your caregiver directs you otherwise.  Do not travel far distances unless it is absolutely necessary and only with the approval of your caregiver.  Take prenatal classes to understand, practice, and ask questions about the labor and delivery.  Make a trial run to the hospital.  Pack your hospital bag.  Prepare the baby's nursery.  Continue to go to all your prenatal visits as directed by your caregiver. SEEK MEDICAL CARE IF:  You are unsure if you are in labor or if your water has broken.  You have dizziness.  You have mild pelvic cramps, pelvic pressure, or nagging pain in your abdominal area.  You have persistent nausea, vomiting, or diarrhea.  You have a bad smelling vaginal discharge.  You have pain with urination. SEEK IMMEDIATE MEDICAL CARE IF:   You have a fever.  You are leaking fluid from your vagina.  You have spotting or bleeding from your vagina.  You have severe abdominal cramping or pain.  You have rapid weight loss or gain.  You have shortness of breath with chest pain.  You notice sudden or extreme swelling of your face, hands, ankles, feet, or legs.  You have not felt your baby move in over an hour.  You have severe headaches that do not go away with medicine.  You have vision changes. Document Released: 07/10/2001 Document Revised: 07/21/2013 Document Reviewed: 09/16/2012 [facility] Patient Information 2015 [place], [place]. This information is not intended to replace advice  given to you by your health care provider. Make sure you discuss any questions you have with your health care provider.  PROTECT YOURSELF & YOUR BABY FROM THE FLU! Because you are pregnant, we at [facility], along with the Centers for Disease Control (CDC), recommend that you receive the flu vaccine to protect yourself and your baby from the flu. The flu is more likely to cause severe illness in pregnant women than in women of reproductive age who are not pregnant. Changes in the immune system, heart, and lungs during pregnancy make pregnant women (and women up to two weeks postpartum) more prone to severe illness from flu, including illness resulting in hospitalization. Flu also may be harmful for a pregnant woman's developing baby. A common flu symptom is fever, which may be associated with neural tube defects and other adverse outcomes for a developing baby. Getting vaccinated can also help protect a baby after birth from flu. (Mom passes antibodies onto the developing baby during her pregnancy.)  A Flu Vaccine is the Best Protection Against Flu Getting a flu vaccine is the first and most important step in protecting against flu. Pregnant women should get a flu shot and not the live attenuated influenza vaccine (LAIV), also known as nasal spray flu vaccine. Flu vaccines given during pregnancy help protect both the mother and her baby from flu. Vaccination has been shown to reduce the risk of flu-associated acute respiratory infection in pregnant women by up to one-half. A 2018 study showed that getting a flu shot reduced a pregnant woman's risk of being hospitalized with flu by an average of 40 percent. Pregnant women who get a flu vaccine are also helping to protect their babies from flu illness for the first several months after their birth, when they are too  young to get vaccinated.   A Long Record of Safety for Flu Shots in Pregnant Women Flu shots have been given to millions of pregnant women over  many years with a good safety record. There is a lot of evidence that flu vaccines can be given safely during pregnancy; though these data are limited for the first trimester. The CDC recommends that pregnant women get vaccinated during any trimester of their pregnancy. It is very important for pregnant women to get the flu shot.   Other Preventive Actions In addition to getting a flu shot, pregnant women should take the same everyday preventive actions the CDC recommends of everyone, including covering coughs, washing hands often, and avoiding people who are sick.  Symptoms and Treatment If you get sick with flu symptoms call your doctor right away. There are antiviral drugs that can treat flu illness and prevent serious flu complications. The CDC recommends prompt treatment for people who have influenza infection or suspected influenza infection and who are at high risk of serious flu complications, such as people with asthma, diabetes (including gestational diabetes), or heart disease. Early treatment of influenza in hospitalized pregnant women has been shown to reduce the length of the hospital stay.  Symptoms Flu symptoms include fever, cough, sore throat, runny or stuffy nose, body aches, headache, chills and fatigue. Some people may also have vomiting and diarrhea. People may be infected with the flu and have respiratory symptoms without a fever.  Early Treatment is Important for Pregnant Women Treatment should begin as soon as possible because antiviral drugs work best when started early (within 48 hours after symptoms start). Antiviral drugs can make your flu illness milder and make you feel better faster. They may also prevent serious health problems that can result from flu illness. Oral oseltamivir (Tamiflu) is the preferred treatment for pregnant women because it has the most studies available to suggest that it is safe and beneficial. Antiviral drugs require a prescription from your  provider. Having a fever caused by flu infection or other infections early in pregnancy may be linked to birth defects in a baby. In addition to taking antiviral drugs, pregnant women who get a fever should treat their fever with Tylenol (acetaminophen) and contact their provider immediately.  When to [place] If you are pregnant and have any of these signs, seek care immediately:  Difficulty breathing or shortness of breath  Pain or pressure in the chest or abdomen  Sudden dizziness  Confusion  Severe or persistent vomiting  High fever that is not responding to Tylenol (or store brand equivalent)  Decreased or no movement of your baby  [email or web address]

## 2019-10-22 ENCOUNTER — Telehealth: Payer: Self-pay | Admitting: *Deleted

## 2019-10-22 NOTE — Telephone Encounter (Signed)
Returned patient's call. Unable to leave message. Mailbox full.

## 2019-10-22 NOTE — Telephone Encounter (Signed)
Pt left message that she has a few questions.

## 2019-10-23 NOTE — Telephone Encounter (Signed)
Unable to leave message. Mailbox full @ 1:20 pm. Pt has an appt Monday. Encounter closed. JSY

## 2019-10-26 ENCOUNTER — Telehealth (INDEPENDENT_AMBULATORY_CARE_PROVIDER_SITE_OTHER): Payer: Medicaid Other | Admitting: Women's Health

## 2019-10-26 ENCOUNTER — Encounter: Payer: Self-pay | Admitting: Women's Health

## 2019-10-26 ENCOUNTER — Other Ambulatory Visit: Payer: Self-pay

## 2019-10-26 VITALS — BP 111/74 | HR 82

## 2019-10-26 DIAGNOSIS — Z3483 Encounter for supervision of other normal pregnancy, third trimester: Secondary | ICD-10-CM

## 2019-10-26 NOTE — Progress Notes (Signed)
   TELEHEALTH VIRTUAL OBSTETRICS VISIT ENCOUNTER NOTE Patient name: Joyce Wilson MRN [id number]  Date of birth: 11-Dec-1995  I connected with patient on 10/26/19 at  8:30 AM EDT by MyChart video  and verified that I am speaking with the correct person using two identifiers. Due to COVID-19 recommendations, pt is not currently in our office.    I discussed the limitations, risks, security and privacy concerns of performing an evaluation and management service by telephone and the availability of in person appointments. I also discussed with the patient that there may be a patient responsible charge related to this service. The patient expressed understanding and agreed to proceed.  Chief Complaint:   Routine Prenatal Visit  History of Present Illness:   Joyce Wilson is a 24 y.o. G89P1001 female at [redacted]w[redacted]d with an Estimated Date of Delivery: 12/04/19 being evaluated today for ongoing management of a low-risk pregnancy.  Today she reports some pressure and occasional pain. Contractions: Irritability. Vag. Bleeding: None.  Movement: Present. denies leaking of fluid. Review of Systems:   Pertinent items are noted in HPI Denies abnormal vaginal discharge w/ itching/odor/irritation, headaches, visual changes, shortness of breath, chest pain, abdominal pain, severe nausea/vomiting, or problems with urination or bowel movements unless otherwise stated above. Pertinent History Reviewed:  Reviewed past medical,surgical, social, obstetrical and family history.  Reviewed problem list, medications and allergies. Physical Assessment:   Vitals:   10/26/19 0839  BP: 111/74  Pulse: 82  There is no height or weight on file to calculate BMI.        Physical Examination:   General:  Alert, oriented and cooperative.   Mental Status: Normal mood and affect perceived. Normal judgment and thought content.  Rest of physical exam deferred due to type of encounter  No results found for this or any previous visit  (from the past 24 hour(s)).  Assessment & Plan:  1) Pregnancy G2P1001 at [redacted]w[redacted]d with an Estimated Date of Delivery: 12/04/19   2) Pressure/occ pain, can try belt/tape, reviewed s/s PTL   Meds: No orders of the defined types were placed in this encounter.   Labs/procedures today: none  Plan:  Continue routine obstetrical care.  Has home bp cuff.  Check bp weekly, let us know if >140/90.  Next visit: prefers will be in person for gbs, tdap    Reviewed: Preterm labor symptoms and general obstetric precautions including but not limited to vaginal bleeding, contractions, leaking of fluid and fetal movement were reviewed in detail with the patient. The patient was advised to call back or seek an in-person office evaluation/go to [facility] at [facility] for any urgent or concerning symptoms. All questions were answered. Please refer to After Visit Summary for other counseling recommendations.    I provided 15 minutes of non-face-to-face time during this encounter.  Follow-up: Return in about 2 weeks (around 11/09/2019) for LROB, in person, CNM.  No orders of the defined types were placed in this encounter.  Cheral Marker CNM, WHNP-BC 10/26/2019 9:00 AM

## 2019-10-26 NOTE — Patient Instructions (Signed)
Joyce Wilson, I greatly value your feedback.  If you receive a survey following your visit with [name] today, we appreciate you taking the time to fill it out.  Thanks, [name], CNM, [facility]  [place]!!! It is now [facility] ([place], [place] [address]) Entrance located off of [address] [place] parking   Go to [name] to register for FREE online childbirth classes    Call the office [phone]) or go to [facility] if:  You begin to have strong, frequent contractions  Your water breaks.  Sometimes it is a big gush of fluid, sometimes it is just a trickle that keeps getting your panties wet or running down your legs  You have vaginal bleeding.  It is normal to have a small amount of spotting if your cervix was checked.   You don't feel your baby moving like normal.  If you don't, get you something to eat and drink and lay down and focus on feeling your baby move.  You should feel at least 10 movements in 2 hours.  If you don't, you should call the office or go to [place] Blood Pressure Monitoring for Patients   Your provider has recommended that you check your blood pressure (BP) at least once a week at home. If you do not have a blood pressure cuff at home, one will be provided for you. Contact your provider if you have not received your monitor within 1 week.   Helpful Tips for Accurate Home Blood Pressure Checks  . Don't smoke, exercise, or drink caffeine 30 minutes before checking your BP . Use the restroom before checking your BP (a full bladder can raise your pressure) . Relax in a comfortable upright chair . Feet on the ground . Left arm resting comfortably on a flat surface at the level of your heart . Legs uncrossed . Back supported . Sit quietly and don't talk . Place the cuff on your bare arm . Adjust snuggly, so that only two fingertips can fit between your skin and  the top of the cuff . Check 2 readings separated by at least one minute . Keep a log of your BP readings . For a visual, please reference this diagram: http://ccnc.care/bpdiagram  Provider Name: [facility]     Phone: [phone]  Zone 1: ALL CLEAR  Continue to monitor your symptoms:  . BP reading is less than 140 (top number) or less than 90 (bottom number)  . No right upper stomach pain . No headaches or seeing spots . No feeling nauseated or throwing up . No swelling in face and hands  Zone 2: CAUTION Call your doctor's office for any of the following:  . BP reading is greater than 140 (top number) or greater than 90 (bottom number)  . Stomach pain under your ribs in the middle or right side . Headaches or seeing spots . Feeling nauseated or throwing up . Swelling in face and hands  Zone 3: EMERGENCY  Seek immediate medical care if you have any of the following:  . BP reading is greater than160 (top number) or greater than 110 (bottom number) . Severe headaches not improving with Tylenol . Serious difficulty catching your breath . Any worsening symptoms from Zone 2    Braxton Hicks Contractions Contractions of the uterus can occur throughout pregnancy, but they are not always a sign that you are in  labor. You may have practice contractions called Braxton Hicks contractions. These false labor contractions are sometimes confused with true labor. What are [place] contractions? Braxton Hicks contractions are tightening movements that occur in the muscles of the uterus before labor. Unlike true labor contractions, these contractions do not result in opening (dilation) and thinning of the cervix. Toward the end of pregnancy (32-34 weeks), Braxton Hicks contractions can happen more often and may become stronger. These contractions are sometimes difficult to tell apart from true labor because they can be very uncomfortable. You should not feel embarrassed if you go to the  hospital with false labor. Sometimes, the only way to tell if you are in true labor is for your health care provider to look for changes in the cervix. The health care provider will do a physical exam and may monitor your contractions. If you are not in true labor, the exam should show that your cervix is not dilating and your water has not broken. If there are no other health problems associated with your pregnancy, it is completely safe for you to be sent home with false labor. You may continue to have Braxton Hicks contractions until you go into true labor. How to tell the difference between true labor and false labor True labor  Contractions last 30-70 seconds.  Contractions become very regular.  Discomfort is usually felt in the top of the uterus, and it spreads to the lower abdomen and low back.  Contractions do not go away with walking.  Contractions usually become more intense and increase in frequency.  The cervix dilates and gets thinner. False labor  Contractions are usually shorter and not as strong as true labor contractions.  Contractions are usually irregular.  Contractions are often felt in the front of the lower abdomen and in the groin.  Contractions may go away when you walk around or change positions while lying down.  Contractions get weaker and are shorter-lasting as time goes on.  The cervix usually does not dilate or become thin. Follow these instructions at home:   Take over-the-counter and prescription medicines only as told by your health care provider.  Keep up with your usual exercises and follow other instructions from your health care provider.  Eat and drink lightly if you think you are going into labor.  If Braxton Hicks contractions are making you uncomfortable: ? Change your position from lying down or resting to walking, or change from walking to resting. ? Sit and rest in a tub of warm water. ? Drink enough fluid to keep your urine pale  yellow. Dehydration may cause these contractions. ? Do slow and deep breathing several times an hour.  Keep all follow-up prenatal visits as told by your health care provider. This is important. Contact a health care provider if:  You have a fever.  You have continuous pain in your abdomen. Get help right away if:  Your contractions become stronger, more regular, and closer together.  You have fluid leaking or gushing from your vagina.  You pass blood-tinged mucus (bloody show).  You have bleeding from your vagina.  You have low back pain that you never had before.  You feel your baby's head pushing down and causing pelvic pressure.  Your baby is not moving inside you as much as it used to. Summary  Contractions that occur before labor are called Braxton Hicks contractions, false labor, or practice contractions.  Braxton Hicks contractions are usually shorter, weaker, farther apart, and less  regular than true labor contractions. True labor contractions usually become progressively stronger and regular, and they become more frequent.  Manage discomfort from [facility] contractions by changing position, resting in a warm bath, drinking plenty of water, or practicing deep breathing. This information is not intended to replace advice given to you by your health care provider. Make sure you discuss any questions you have with your health care provider. Document Revised: 06/28/2017 Document Reviewed: 11/29/2016 Elsevier Patient Education  2020 [employer].

## 2019-11-11 ENCOUNTER — Other Ambulatory Visit (HOSPITAL_COMMUNITY)
Admission: RE | Admit: 2019-11-11 | Discharge: 2019-11-11 | Disposition: A | Discharge status: Home / Self Care | Payer: Medicaid Other | Source: Ambulatory Visit | Attending: Advanced Practice Midwife | Admitting: Advanced Practice Midwife

## 2019-11-11 ENCOUNTER — Other Ambulatory Visit: Payer: Self-pay

## 2019-11-11 ENCOUNTER — Ambulatory Visit (INDEPENDENT_AMBULATORY_CARE_PROVIDER_SITE_OTHER): Payer: Medicaid Other | Admitting: Advanced Practice Midwife

## 2019-11-11 VITALS — BP 130/86 | HR 94 | Wt 140.0 lb

## 2019-11-11 DIAGNOSIS — Z3483 Encounter for supervision of other normal pregnancy, third trimester: Secondary | ICD-10-CM | POA: Insufficient documentation

## 2019-11-11 DIAGNOSIS — Z3A36 36 weeks gestation of pregnancy: Secondary | ICD-10-CM | POA: Diagnosis not present

## 2019-11-11 DIAGNOSIS — Z23 Encounter for immunization: Secondary | ICD-10-CM

## 2019-11-11 LAB — POCT URINALYSIS DIPSTICK OB
Blood, UA: NEGATIVE
Glucose, UA: NEGATIVE
Ketones, UA: NEGATIVE
Leukocytes, UA: NEGATIVE
Nitrite, UA: NEGATIVE

## 2019-11-11 NOTE — Progress Notes (Signed)
   LOW-RISK PREGNANCY VISIT Patient name: Joyce Wilson MRN [id number]  Date of birth: 06-05-1996 Chief Complaint:   Routine Prenatal Visit  History of Present Illness:   Joyce Wilson is a 24 y.o. G49P1001 female at [redacted]w[redacted]d with an Estimated Date of Delivery: 12/04/19 being seen today for ongoing management of a low-risk pregnancy.  Today she reports pelvic pressure. Contractions: Irritability. Vag. Bleeding: None.  Movement: Present. denies leaking of fluid. Review of Systems:   Pertinent items are noted in HPI Denies abnormal vaginal discharge w/ itching/odor/irritation, headaches, visual changes, shortness of breath, chest pain, abdominal pain, severe nausea/vomiting, or problems with urination or bowel movements unless otherwise stated above. Pertinent History Reviewed:  Reviewed past medical,surgical, social, obstetrical and family history.  Reviewed problem list, medications and allergies. Physical Assessment:   Vitals:   11/11/19 1024  BP: 130/86  Pulse: 94  Weight: 140 lb (63.5 kg)  Body mass index is 24.8 kg/m.        Physical Examination:   General appearance: Well appearing, and in no distress  Mental status: Alert, oriented to person, place, and time  Skin: Warm & dry  Cardiovascular: Normal heart rate noted  Respiratory: Normal respiratory effort, no distress  Abdomen: Soft, gravid, nontender  Pelvic: Cervical exam performed  Dilation: 2 Effacement (%): 50 Station: -2  Extremities: Edema: None  Fetal Status: Fetal Heart Rate (bpm): 142 u/s Fundal Height: 35 cm Movement: Present Presentation: Vertex  Results for orders placed or performed in visit on 11/11/19 (from the past 24 hour(s))  POC Urinalysis Dipstick OB   Collection Time: 11/11/19 10:28 AM  Result Value Ref Range   Color, UA     Clarity, UA     Glucose, UA Negative Negative   Bilirubin, UA     Ketones, UA neg    Spec Grav, UA     Blood, UA neg    pH, UA     POC,PROTEIN,UA Trace Negative, Trace, Small  (1+), Moderate (2+), Large (3+), 4+   Urobilinogen, UA     Nitrite, UA neg    Leukocytes, UA Negative Negative   Appearance     Odor      Assessment & Plan:  1) Low-risk pregnancy G2P1001 at [redacted]w[redacted]d with an Estimated Date of Delivery: 12/04/19   2) GBS collected, had a rash rxn as an infant to PCN x 1; will add sensitivities, but she may be able to receive PCN   Meds: No orders of the defined types were placed in this encounter.  Labs/procedures today: GBS, GC/chlam  Plan:  Continue routine obstetrical care   Reviewed: Term labor symptoms and general obstetric precautions including but not limited to vaginal bleeding, contractions, leaking of fluid and fetal movement were reviewed in detail with the patient.  All questions were answered. Has home bp cuff. Check bp weekly, let us know if >140/90.   Follow-up: Return in about 1 week (around 11/18/2019) for LROB, in person.  Orders Placed This Encounter  Procedures  . Strep Gp B NAA+Rflx  . Tdap vaccine greater than or equal to 7yo IM  . POC Urinalysis Dipstick OB   Arabella Merles [facility] 11/11/2019 10:49 AM

## 2019-11-11 NOTE — Patient Instructions (Signed)
Braxton Hicks Contractions °Contractions of the uterus can occur throughout pregnancy, but they are not always a sign that you are in labor. You may have practice contractions called Braxton Hicks contractions. These false labor contractions are sometimes confused with true labor. °What are Braxton Hicks contractions? °Braxton Hicks contractions are tightening movements that occur in the muscles of the uterus before labor. Unlike true labor contractions, these contractions do not result in opening (dilation) and thinning of the cervix. Toward the end of pregnancy (32-34 weeks), Braxton Hicks contractions can happen more often and may become stronger. These contractions are sometimes difficult to tell apart from true labor because they can be very uncomfortable. You should not feel embarrassed if you go to the hospital with false labor. °Sometimes, the only way to tell if you are in true labor is for your health care provider to look for changes in the cervix. The health care provider will do a physical exam and may monitor your contractions. If you are not in true labor, the exam should show that your cervix is not dilating and your water has not broken. °If there are no other health problems associated with your pregnancy, it is completely safe for you to be sent home with false labor. You may continue to have Braxton Hicks contractions until you go into true labor. °How to tell the difference between true labor and false labor °True labor °· Contractions last 30-70 seconds. °· Contractions become very regular. °· Discomfort is usually felt in the top of the uterus, and it spreads to the lower abdomen and low back. °· Contractions do not go away with walking. °· Contractions usually become more intense and increase in frequency. °· The cervix dilates and gets thinner. °False labor °· Contractions are usually shorter and not as strong as true labor contractions. °· Contractions are usually irregular. °· Contractions  are often felt in the front of the lower abdomen and in the groin. °· Contractions may go away when you walk around or change positions while lying down. °· Contractions get weaker and are shorter-lasting as time goes on. °· The cervix usually does not dilate or become thin. °Follow these instructions at home: ° °· Take over-the-counter and prescription medicines only as told by your health care provider. °· Keep up with your usual exercises and follow other instructions from your health care provider. °· Eat and drink lightly if you think you are going into labor. °· If Braxton Hicks contractions are making you uncomfortable: °? Change your position from lying down or resting to walking, or change from walking to resting. °? Sit and rest in a tub of warm water. °? Drink enough fluid to keep your urine pale yellow. Dehydration may cause these contractions. °? Do slow and deep breathing several times an hour. °· Keep all follow-up prenatal visits as told by your health care provider. This is important. °Contact a health care provider if: °· You have a fever. °· You have continuous pain in your abdomen. °Get help right away if: °· Your contractions become stronger, more regular, and closer together. °· You have fluid leaking or gushing from your vagina. °· You pass blood-tinged mucus (bloody show). °· You have bleeding from your vagina. °· You have low back pain that you never had before. °· You feel your baby’s head pushing down and causing pelvic pressure. °· Your baby is not moving inside you as much as it used to. °Summary °· Contractions that occur before labor are   called Braxton Hicks contractions, false labor, or practice contractions. °· Braxton Hicks contractions are usually shorter, weaker, farther apart, and less regular than true labor contractions. True labor contractions usually become progressively stronger and regular, and they become more frequent. °· Manage discomfort from Braxton Hicks contractions  by changing position, resting in a warm bath, drinking plenty of water, or practicing deep breathing. °This information is not intended to replace advice given to you by your health care provider. Make sure you discuss any questions you have with your health care provider. °Document Revised: 06/28/2017 Document Reviewed: 11/29/2016 °Elsevier Patient Education © 2020 Elsevier Inc. ° °

## 2019-11-12 LAB — CERVICOVAGINAL ANCILLARY ONLY
Chlamydia: NEGATIVE
Comment: NEGATIVE
Comment: NORMAL
Neisseria Gonorrhea: NEGATIVE

## 2019-11-13 LAB — STREP GP B NAA+RFLX: Strep Gp B NAA+Rflx: NEGATIVE

## 2019-11-18 ENCOUNTER — Ambulatory Visit (INDEPENDENT_AMBULATORY_CARE_PROVIDER_SITE_OTHER): Payer: Medicaid Other | Admitting: Advanced Practice Midwife

## 2019-11-18 ENCOUNTER — Other Ambulatory Visit: Payer: Self-pay

## 2019-11-18 VITALS — BP 134/83 | HR 110 | Wt 141.0 lb

## 2019-11-18 DIAGNOSIS — Z3483 Encounter for supervision of other normal pregnancy, third trimester: Secondary | ICD-10-CM

## 2019-11-18 DIAGNOSIS — Z3A37 37 weeks gestation of pregnancy: Secondary | ICD-10-CM

## 2019-11-18 LAB — POCT URINALYSIS DIPSTICK OB
Blood, UA: NEGATIVE
Glucose, UA: NEGATIVE
Ketones, UA: NEGATIVE
Leukocytes, UA: NEGATIVE
Nitrite, UA: NEGATIVE
POC,PROTEIN,UA: NEGATIVE

## 2019-11-18 NOTE — Progress Notes (Signed)
   LOW-RISK PREGNANCY VISIT Patient name: Joyce Wilson MRN [id number]  Date of birth: 1996/03/27 Chief Complaint:   Routine Prenatal Visit  History of Present Illness:   Joyce Wilson is a 24 y.o. G59P1001 female at [redacted]w[redacted]d with an Estimated Date of Delivery: 12/04/19 being seen today for ongoing management of a low-risk pregnancy.  Today she reports doing well but ready!. Contractions: Irritability. Vag. Bleeding: None.  Movement: Present. denies leaking of fluid. Review of Systems:   Pertinent items are noted in HPI Denies abnormal vaginal discharge w/ itching/odor/irritation, headaches, visual changes, shortness of breath, chest pain, abdominal pain, severe nausea/vomiting, or problems with urination or bowel movements unless otherwise stated above. Pertinent History Reviewed:  Reviewed past medical,surgical, social, obstetrical and family history.  Reviewed problem list, medications and allergies. Physical Assessment:   Vitals:   11/18/19 0949  BP: 134/83  Pulse: (!) 110  Weight: 141 lb (64 kg)  Body mass index is 24.98 kg/m.        Physical Examination:   General appearance: Well appearing, and in no distress  Mental status: Alert, oriented to person, place, and time  Skin: Warm & dry  Cardiovascular: Normal heart rate noted  Respiratory: Normal respiratory effort, no distress  Abdomen: Soft, gravid, nontender  Pelvic: Cervical exam performed  Dilation: 2.5 Effacement (%): 50 Station: -2  Extremities: Edema: None  Fetal Status: Fetal Heart Rate (bpm): 150 Fundal Height: 36 cm Movement: Present Presentation: Vertex  Results for orders placed or performed in visit on 11/18/19 (from the past 24 hour(s))  POC Urinalysis Dipstick OB   Collection Time: 11/18/19  9:51 AM  Result Value Ref Range   Color, UA     Clarity, UA     Glucose, UA Negative Negative   Bilirubin, UA     Ketones, UA neg    Spec Grav, UA     Blood, UA neg    pH, UA     POC,PROTEIN,UA Negative Negative,  Trace, Small (1+), Moderate (2+), Large (3+), 4+   Urobilinogen, UA     Nitrite, UA neg    Leukocytes, UA Negative Negative   Appearance     Odor      Assessment & Plan:  1) Low-risk pregnancy G2P1001 at [redacted]w[redacted]d with an Estimated Date of Delivery: 12/04/19     Meds: No orders of the defined types were placed in this encounter.  Labs/procedures today: SVE  Plan:  Continue routine obstetrical care   Reviewed: Term labor symptoms and general obstetric precautions including but not limited to vaginal bleeding, contractions, leaking of fluid and fetal movement were reviewed in detail with the patient.  All questions were answered. Has home bp cuff. Check bp weekly, let us know if >140/90.   Follow-up: Return in about 1 week (around 11/25/2019) for LROB, in person.  Orders Placed This Encounter  Procedures  . POC Urinalysis Dipstick OB   Arabella Merles [facility] 11/18/2019 10:16 AM

## 2019-11-18 NOTE — Patient Instructions (Signed)
Braxton Hicks Contractions °Contractions of the uterus can occur throughout pregnancy, but they are not always a sign that you are in labor. You may have practice contractions called Braxton Hicks contractions. These false labor contractions are sometimes confused with true labor. °What are Braxton Hicks contractions? °Braxton Hicks contractions are tightening movements that occur in the muscles of the uterus before labor. Unlike true labor contractions, these contractions do not result in opening (dilation) and thinning of the cervix. Toward the end of pregnancy (32-34 weeks), Braxton Hicks contractions can happen more often and may become stronger. These contractions are sometimes difficult to tell apart from true labor because they can be very uncomfortable. You should not feel embarrassed if you go to the hospital with false labor. °Sometimes, the only way to tell if you are in true labor is for your health care provider to look for changes in the cervix. The health care provider will do a physical exam and may monitor your contractions. If you are not in true labor, the exam should show that your cervix is not dilating and your water has not broken. °If there are no other health problems associated with your pregnancy, it is completely safe for you to be sent home with false labor. You may continue to have Braxton Hicks contractions until you go into true labor. °How to tell the difference between true labor and false labor °True labor °· Contractions last 30-70 seconds. °· Contractions become very regular. °· Discomfort is usually felt in the top of the uterus, and it spreads to the lower abdomen and low back. °· Contractions do not go away with walking. °· Contractions usually become more intense and increase in frequency. °· The cervix dilates and gets thinner. °False labor °· Contractions are usually shorter and not as strong as true labor contractions. °· Contractions are usually irregular. °· Contractions  are often felt in the front of the lower abdomen and in the groin. °· Contractions may go away when you walk around or change positions while lying down. °· Contractions get weaker and are shorter-lasting as time goes on. °· The cervix usually does not dilate or become thin. °Follow these instructions at home: ° °· Take over-the-counter and prescription medicines only as told by your health care provider. °· Keep up with your usual exercises and follow other instructions from your health care provider. °· Eat and drink lightly if you think you are going into labor. °· If Braxton Hicks contractions are making you uncomfortable: °? Change your position from lying down or resting to walking, or change from walking to resting. °? Sit and rest in a tub of warm water. °? Drink enough fluid to keep your urine pale yellow. Dehydration may cause these contractions. °? Do slow and deep breathing several times an hour. °· Keep all follow-up prenatal visits as told by your health care provider. This is important. °Contact a health care provider if: °· You have a fever. °· You have continuous pain in your abdomen. °Get help right away if: °· Your contractions become stronger, more regular, and closer together. °· You have fluid leaking or gushing from your vagina. °· You pass blood-tinged mucus (bloody show). °· You have bleeding from your vagina. °· You have low back pain that you never had before. °· You feel your baby’s head pushing down and causing pelvic pressure. °· Your baby is not moving inside you as much as it used to. °Summary °· Contractions that occur before labor are   called Braxton Hicks contractions, false labor, or practice contractions. °· Braxton Hicks contractions are usually shorter, weaker, farther apart, and less regular than true labor contractions. True labor contractions usually become progressively stronger and regular, and they become more frequent. °· Manage discomfort from Braxton Hicks contractions  by changing position, resting in a warm bath, drinking plenty of water, or practicing deep breathing. °This information is not intended to replace advice given to you by your health care provider. Make sure you discuss any questions you have with your health care provider. °Document Revised: 06/28/2017 Document Reviewed: 11/29/2016 °Elsevier Patient Education © 2020 Elsevier Inc. ° °

## 2019-11-25 ENCOUNTER — Other Ambulatory Visit: Payer: Self-pay

## 2019-11-25 ENCOUNTER — Ambulatory Visit (INDEPENDENT_AMBULATORY_CARE_PROVIDER_SITE_OTHER): Payer: Medicaid Other | Admitting: Advanced Practice Midwife

## 2019-11-25 ENCOUNTER — Encounter: Payer: Self-pay | Admitting: Advanced Practice Midwife

## 2019-11-25 VITALS — BP 122/79 | HR 103 | Wt 140.5 lb

## 2019-11-25 DIAGNOSIS — Z331 Pregnant state, incidental: Secondary | ICD-10-CM

## 2019-11-25 DIAGNOSIS — Z3A38 38 weeks gestation of pregnancy: Secondary | ICD-10-CM

## 2019-11-25 DIAGNOSIS — Z3483 Encounter for supervision of other normal pregnancy, third trimester: Secondary | ICD-10-CM

## 2019-11-25 DIAGNOSIS — Z1389 Encounter for screening for other disorder: Secondary | ICD-10-CM

## 2019-11-25 LAB — POCT URINALYSIS DIPSTICK OB
Blood, UA: NEGATIVE
Glucose, UA: NEGATIVE
Ketones, UA: NEGATIVE
Nitrite, UA: NEGATIVE

## 2019-11-25 NOTE — Progress Notes (Signed)
   LOW-RISK PREGNANCY VISIT Patient name: Joyce Wilson MRN [id number]  Date of birth: 05-24-1996 Chief Complaint:   Routine Prenatal Visit  History of Present Illness:   Joyce Wilson is a 24 y.o. G37P1001 female at [redacted]w[redacted]d with an Estimated Date of Delivery: 12/04/19 being seen today for ongoing management of a low-risk pregnancy.  Today she reports occ ctx, no pattern. Contractions: Irregular. Vag. Bleeding: None.  Movement: Present. denies leaking of fluid. Review of Systems:   Pertinent items are noted in HPI Denies abnormal vaginal discharge w/ itching/odor/irritation, headaches, visual changes, shortness of breath, chest pain, abdominal pain, severe nausea/vomiting, or problems with urination or bowel movements unless otherwise stated above. Pertinent History Reviewed:  Reviewed past medical,surgical, social, obstetrical and family history.  Reviewed problem list, medications and allergies. Physical Assessment:   Vitals:   11/25/19 0905  BP: 122/79  Pulse: (!) 103  Weight: 140 lb 8 oz (63.7 kg)  Body mass index is 24.89 kg/m.        Physical Examination:   General appearance: Well appearing, and in no distress  Mental status: Alert, oriented to person, place, and time  Skin: Warm & dry  Cardiovascular: Normal heart rate noted  Respiratory: Normal respiratory effort, no distress  Abdomen: Soft, gravid, nontender  Pelvic: Cervical exam performed  Dilation: 3.5 Effacement (%): 80 Station: -2  Extremities: Edema: None  Fetal Status: Fetal Heart Rate (bpm): 149 Fundal Height: 37 cm Movement: Present Presentation: Vertex  Results for orders placed or performed in visit on 11/25/19 (from the past 24 hour(s))  POC Urinalysis Dipstick OB   Collection Time: 11/25/19  9:06 AM  Result Value Ref Range   Color, UA     Clarity, UA     Glucose, UA Negative Negative   Bilirubin, UA     Ketones, UA neg    Spec Grav, UA     Blood, UA neg    pH, UA     POC,PROTEIN,UA Trace Negative,  Trace, Small (1+), Moderate (2+), Large (3+), 4+   Urobilinogen, UA     Nitrite, UA neg    Leukocytes, UA Trace (A) Negative   Appearance     Odor      Assessment & Plan:  1) Low-risk pregnancy G2P1001 at [redacted]w[redacted]d with an Estimated Date of Delivery: 12/04/19   2) GBS neg   Meds: No orders of the defined types were placed in this encounter.  Labs/procedures today: SVE  Plan:  Continue routine obstetrical care with plan for postdates IOL at 41wks if still pregnant at next visit.  Reviewed: Term labor symptoms and general obstetric precautions including but not limited to vaginal bleeding, contractions, leaking of fluid and fetal movement were reviewed in detail with the patient.  All questions were answered. Has home bp cuff.  Follow-up: Return in about 9 days (around 12/04/2019) for LROB, NST, in person.  Orders Placed This Encounter  Procedures  . POC Urinalysis Dipstick OB   Arabella Merles [facility] 11/25/2019 9:37 AM

## 2019-11-28 ENCOUNTER — Encounter (HOSPITAL_COMMUNITY): Payer: Self-pay | Admitting: Obstetrics and Gynecology

## 2019-11-28 ENCOUNTER — Inpatient Hospital Stay (HOSPITAL_COMMUNITY): Payer: Medicaid Other | Admitting: Anesthesiology

## 2019-11-28 ENCOUNTER — Inpatient Hospital Stay (HOSPITAL_COMMUNITY)
Admission: AD | Admit: 2019-11-28 | Discharge: 2019-11-29 | DRG: 807 | Disposition: A | Discharge status: Home / Self Care | Payer: Medicaid Other | Attending: Obstetrics and Gynecology | Admitting: Obstetrics and Gynecology

## 2019-11-28 ENCOUNTER — Other Ambulatory Visit: Payer: Self-pay

## 2019-11-28 DIAGNOSIS — Z8616 Personal history of COVID-19: Secondary | ICD-10-CM

## 2019-11-28 DIAGNOSIS — Z87891 Personal history of nicotine dependence: Secondary | ICD-10-CM | POA: Diagnosis not present

## 2019-11-28 DIAGNOSIS — Z880 Allergy status to penicillin: Secondary | ICD-10-CM | POA: Diagnosis not present

## 2019-11-28 DIAGNOSIS — Z3A39 39 weeks gestation of pregnancy: Secondary | ICD-10-CM | POA: Diagnosis not present

## 2019-11-28 DIAGNOSIS — O26893 Other specified pregnancy related conditions, third trimester: Secondary | ICD-10-CM | POA: Diagnosis present

## 2019-11-28 LAB — CBC
HCT: 35.3 % — ABNORMAL LOW (ref 36.0–46.0)
Hemoglobin: 10.7 g/dL — ABNORMAL LOW (ref 12.0–15.0)
MCH: 25.5 pg — ABNORMAL LOW (ref 26.0–34.0)
MCHC: 30.3 g/dL (ref 30.0–36.0)
MCV: 84 fL (ref 80.0–100.0)
Platelets: 290 10*3/uL (ref 150–400)
RBC: 4.2 MIL/uL (ref 3.87–5.11)
RDW: 14.3 % (ref 11.5–15.5)
WBC: 11.6 10*3/uL — ABNORMAL HIGH (ref 4.0–10.5)
nRBC: 0 % (ref 0.0–0.2)

## 2019-11-28 LAB — TYPE AND SCREEN
ABO/RH(D): A POS
Antibody Screen: NEGATIVE

## 2019-11-28 LAB — ABO/RH: ABO/RH(D): A POS

## 2019-11-28 LAB — RPR: RPR Ser Ql: NONREACTIVE

## 2019-11-28 MED ORDER — ZOLPIDEM TARTRATE 5 MG PO TABS: 5.0000 mg | ORAL_TABLET | Freq: Every evening | ORAL | Status: DC | PRN

## 2019-11-28 MED ORDER — LACTATED RINGERS IV SOLN: INTRAVENOUS | Status: DC

## 2019-11-28 MED ORDER — FLEET ENEMA 7-19 GM/118ML RE ENEM: 1.0000 | ENEMA | RECTAL | Status: DC | PRN

## 2019-11-28 MED ORDER — SIMETHICONE 80 MG PO CHEW: 80.0000 mg | CHEWABLE_TABLET | ORAL | Status: DC | PRN

## 2019-11-28 MED ORDER — OXYCODONE-ACETAMINOPHEN 5-325 MG PO TABS: 2.0000 | ORAL_TABLET | ORAL | Status: DC | PRN

## 2019-11-28 MED ORDER — LEVONORGESTREL 19.5 MCG/DAY IU IUD: INTRAUTERINE_SYSTEM | Freq: Once | INTRAUTERINE | Status: DC

## 2019-11-28 MED ORDER — PHENYLEPHRINE 40 MCG/ML (10ML) SYRINGE FOR IV PUSH (FOR BLOOD PRESSURE SUPPORT)
80.0000 ug | PREFILLED_SYRINGE | INTRAVENOUS | Status: DC | PRN
  Filled 2019-11-28: qty 10

## 2019-11-28 MED ORDER — SOD CITRATE-CITRIC ACID 500-334 MG/5ML PO SOLN: 30.0000 mL | ORAL | Status: DC | PRN

## 2019-11-28 MED ORDER — LACTATED RINGERS IV SOLN: 500.0000 mL | INTRAVENOUS | Status: DC | PRN

## 2019-11-28 MED ORDER — LIDOCAINE HCL (PF) 1 % IJ SOLN: 30.0000 mL | INTRAMUSCULAR | Status: DC | PRN

## 2019-11-28 MED ORDER — EPHEDRINE 5 MG/ML INJ: 10.0000 mg | INTRAVENOUS | Status: DC | PRN

## 2019-11-28 MED ORDER — COCONUT OIL OIL: 1.0000 "application " | TOPICAL_OIL | Status: DC | PRN

## 2019-11-28 MED ORDER — TETANUS-DIPHTH-ACELL PERTUSSIS 5-2.5-18.5 LF-MCG/0.5 IM SUSP: 0.5000 mL | Freq: Once | INTRAMUSCULAR | Status: DC

## 2019-11-28 MED ORDER — OXYTOCIN BOLUS FROM INFUSION
500.0000 mL | Freq: Once | INTRAVENOUS | Status: AC
  Administered 2019-11-28: 500 mL via INTRAVENOUS

## 2019-11-28 MED ORDER — DIBUCAINE (PERIANAL) 1 % EX OINT: 1.0000 "application " | TOPICAL_OINTMENT | CUTANEOUS | Status: DC | PRN

## 2019-11-28 MED ORDER — DIPHENHYDRAMINE HCL 25 MG PO CAPS: 25.0000 mg | ORAL_CAPSULE | Freq: Four times a day (QID) | ORAL | Status: DC | PRN

## 2019-11-28 MED ORDER — PRENATAL MULTIVITAMIN CH
1.0000 | ORAL_TABLET | Freq: Every day | ORAL | Status: DC
  Administered 2019-11-28: 1 via ORAL
  Filled 2019-11-28: qty 1

## 2019-11-28 MED ORDER — BENZOCAINE-MENTHOL 20-0.5 % EX AERO
1.0000 "application " | INHALATION_SPRAY | CUTANEOUS | Status: DC | PRN
  Administered 2019-11-28: 1 via TOPICAL
  Filled 2019-11-28: qty 56

## 2019-11-28 MED ORDER — LIDOCAINE HCL (PF) 1 % IJ SOLN
INTRAMUSCULAR | Status: DC | PRN
  Administered 2019-11-28: 5 mL via EPIDURAL

## 2019-11-28 MED ORDER — ONDANSETRON HCL 4 MG/2ML IJ SOLN: 4.0000 mg | INTRAMUSCULAR | Status: DC | PRN

## 2019-11-28 MED ORDER — ACETAMINOPHEN 325 MG PO TABS: 650.0000 mg | ORAL_TABLET | ORAL | Status: DC | PRN

## 2019-11-28 MED ORDER — SODIUM CHLORIDE (PF) 0.9 % IJ SOLN
INTRAMUSCULAR | Status: DC | PRN
  Administered 2019-11-28: 12 mL/h via EPIDURAL

## 2019-11-28 MED ORDER — SENNOSIDES-DOCUSATE SODIUM 8.6-50 MG PO TABS
2.0000 | ORAL_TABLET | ORAL | Status: DC
  Administered 2019-11-28: 2 via ORAL
  Filled 2019-11-28: qty 2

## 2019-11-28 MED ORDER — DIPHENHYDRAMINE HCL 50 MG/ML IJ SOLN: 12.5000 mg | INTRAMUSCULAR | Status: DC | PRN

## 2019-11-28 MED ORDER — FENTANYL-BUPIVACAINE-NACL 0.5-0.125-0.9 MG/250ML-% EP SOLN
12.0000 mL/h | EPIDURAL | Status: DC | PRN
  Filled 2019-11-28: qty 250

## 2019-11-28 MED ORDER — OXYCODONE-ACETAMINOPHEN 5-325 MG PO TABS: 1.0000 | ORAL_TABLET | ORAL | Status: DC | PRN

## 2019-11-28 MED ORDER — OXYTOCIN 40 UNITS IN NORMAL SALINE INFUSION - SIMPLE MED
2.5000 [IU]/h | INTRAVENOUS | Status: DC
  Filled 2019-11-28: qty 1000

## 2019-11-28 MED ORDER — IBUPROFEN 600 MG PO TABS
600.0000 mg | ORAL_TABLET | Freq: Four times a day (QID) | ORAL | Status: DC
  Administered 2019-11-28 (×3): 600 mg via ORAL
  Filled 2019-11-28 (×5): qty 1

## 2019-11-28 MED ORDER — WITCH HAZEL-GLYCERIN EX PADS: 1.0000 "application " | MEDICATED_PAD | CUTANEOUS | Status: DC | PRN

## 2019-11-28 MED ORDER — PHENYLEPHRINE 40 MCG/ML (10ML) SYRINGE FOR IV PUSH (FOR BLOOD PRESSURE SUPPORT): 80.0000 ug | PREFILLED_SYRINGE | INTRAVENOUS | Status: DC | PRN

## 2019-11-28 MED ORDER — LACTATED RINGERS IV SOLN: 500.0000 mL | Freq: Once | INTRAVENOUS | Status: DC

## 2019-11-28 MED ORDER — ONDANSETRON HCL 4 MG PO TABS
4.0000 mg | ORAL_TABLET | ORAL | Status: DC | PRN
  Administered 2019-11-29 (×2): 4 mg via ORAL
  Filled 2019-11-28 (×2): qty 1

## 2019-11-28 MED ORDER — ONDANSETRON HCL 4 MG/2ML IJ SOLN: 4.0000 mg | Freq: Four times a day (QID) | INTRAMUSCULAR | Status: DC | PRN

## 2019-11-28 NOTE — Discharge Instructions (Signed)

## 2019-11-28 NOTE — MAU Note (Signed)
Contractions 3-5 mins apart.  Denies LOF.  Some spotting.  Endorses + FM.  No complications w/ pregnancy.

## 2019-11-28 NOTE — Anesthesia Postprocedure Evaluation (Signed)
Anesthesia Post Note  Patient: Designer, jewellery  Procedure(s) Performed: AN AD HOC LABOR EPIDURAL     Patient location during evaluation: Mother Baby Anesthesia Type: Epidural Level of consciousness: oriented, awake and alert and patient cooperative Pain management: pain level controlled Vital Signs Assessment: post-procedure vital signs reviewed and stable Respiratory status: spontaneous breathing and nonlabored ventilation Cardiovascular status: stable Postop Assessment: no headache, no apparent nausea or vomiting, patient able to bend at knees, able to ambulate, adequate PO intake and no backache Anesthetic complications: no Comments: Pt states that she is up, walking, and able to use the restroom. Said that her pain was well controlled. Discussed w/ patient to make pain needs known to bedside RN.    Last Vitals:  Vitals:   11/28/19 0848 11/28/19 1020  BP: 120/82 129/84  Pulse: 95 92  Resp: 18 20  Temp: 36.8 C 36.9 C    Last Pain:  Vitals:   11/28/19 1020  TempSrc:   PainSc: 0-No pain   Pain Goal:                   Boris Lown

## 2019-11-28 NOTE — Lactation Note (Signed)
This note was copied from a baby's chart. Lactation Consultation Note  Patient Name: Joyce Wilson [id number] Date: 11/28/2019 Reason for consult: Initial assessment;Term  [facility] student did an initial assessment with family.  Mom was in the bed with baby.  Dad and Joyce Wilson were sitting on the couch.   G2P2 mom, had a vaginal birth with a baby Joyce who is 9hrs old.  Mom noted that she had not had any breastfeeding changes during her pregnancy.  MOB nursed 1st baby for about 2 months.  She mentioned that her struggles w/ nursing first baby was due to latching, and pumping.  Mom has a Medella pump at home.   [facility] student attempted to help mom with breastfeeding baby.  Baby Joyce nursed for about but then fell asleep.  [facility] student taught mom how to do hand expression.  Several drops of colostrum were expressed from both breast into a spoon.  Baby was fed the drops of colostrum.  [facility] student gave family a breastfeeding booklet, educated on feeding cues, and pees/poops.  The plan is for MOB to; -Feed baby on cue  -Continue to do STS -Do hand expression several times in a day. If baby doesn't wake for a feeding, express colostrum in a spoon and feed to baby or rub expressed milk on gums.        Joyce Wilson. Joyce Wilson 11/28/2019, 4:48 PM

## 2019-11-28 NOTE — Discharge Summary (Signed)
Postpartum Discharge Summary      Patient Name: Joyce Wilson DOB: 1996-01-17 MRN: [id number]  Date of admission: 11/28/2019 Delivering Provider: Merilyn Baba   Date of discharge: 11/29/2019  Admitting diagnosis: Normal labor [O80, Z37.9] Intrauterine pregnancy: [redacted]w[redacted]d    Secondary diagnosis:  Principal Problem:   Normal labor  Additional problems: H/o shoulder dystocia     Discharge diagnosis: Term Pregnancy Delivered                                                                                                Post partum procedures: None  Augmentation: AROM  Complications: None  Hospital course:  Onset of Labor With Vaginal Delivery     24y.o. yo G2P1001 at 310w1das admitted in Active Labor on 11/28/2019. Patient had an uncomplicated labor course as follows:  Membrane Rupture Time/Date: 5:49 AM ,11/28/2019   Intrapartum Procedures: Episiotomy: None [1]                                         Lacerations:  1st degree [2];Perineal [11];Labial [10]  Patient had a delivery of a Viable infant. 11/28/2019  Information for the patient's newborn:  PrZenna, Traister0[[id number]]Delivery Method: Vaginal, Spontaneous(Filed from Delivery Summary)     Pateint had an uncomplicated postpartum course.  She is ambulating, tolerating a regular diet, passing flatus, and urinating well. Patient is discharged home in stable condition on 11/29/19.  Delivery time: 6:57 AM    Magnesium Sulfate received: No BMZ received: No Rhophylac:N/A MMR:N/A Transfusion:No  Physical exam  Vitals:   11/28/19 1534 11/28/19 2015 11/28/19 2339 11/29/19 0600  BP: 110/70 114/76 107/71 120/81  Pulse: 75 77 80 88  Resp:  '18 18 18  ' Temp: 97.9 F (36.6 C) 97.9 F (36.6 C) 97.6 F (36.4 C) 98.2 F (36.8 C)  TempSrc:  Oral Oral Oral  SpO2:  99%    Weight:      Height:       General: alert, cooperative and no distress Lochia: appropriate Uterine Fundus: firm Incision: N/A DVT Evaluation: No  evidence of DVT seen on physical exam. Labs: Lab Results  Component Value Date   WBC 7.9 11/29/2019   HGB 9.8 (L) 11/29/2019   HCT 32.1 (L) 11/29/2019   MCV 83.6 11/29/2019   PLT 217 11/29/2019   No flowsheet data found. Edinburgh Score: Edinburgh Postnatal Depression Scale Screening Tool 11/28/2019  I have been able to laugh and see the funny side of things. (No Data)    Discharge instruction: per After Visit Summary and "Baby and Me Booklet".  After visit meds:  Allergies as of 11/29/2019      Reactions   Penicillins    Has patient had a PCN reaction causing immediate rash, facial/tongue/throat swelling, SOB or lightheadedness with hypotension: unknown Has patient had a PCN reaction causing severe rash involving mucus membranes or skin necrosis: unknown Has patient had a PCN reaction that required hospitalization: no Has patient had a  PCN reaction occurring within the last 10 years: no If all of the above answers are "NO", then may proceed with Cephalosporin use. Pt does not know what reaction she had      Medication List    TAKE these medications   acetaminophen 325 MG tablet Commonly known as: Tylenol Take 2 tablets (650 mg total) by mouth every 4 (four) hours as needed (for pain scale < 4).   Blood Pressure Monitor Misc For regular home bp monitoring during pregnancy   cephALEXin 750 MG capsule Commonly known as: KEFLEX Take 750 mg by mouth 4 (four) times daily.   ibuprofen 600 MG tablet Commonly known as: ADVIL Take 1 tablet (600 mg total) by mouth every 6 (six) hours.   ondansetron 4 MG disintegrating tablet Commonly known as: Zofran ODT Take 1 tablet (4 mg total) by mouth every 8 (eight) hours as needed for nausea or vomiting.   PRENATAL VITAMIN PO Take by mouth.       Diet: routine diet  Activity: Advance as tolerated. Pelvic rest for 6 weeks.   Outpatient follow up:4 weeks Follow up Appt: Future Appointments  Date Time Provider [place]   12/04/2019 10:50 AM Jonnie Kind, MD [facility]   Follow up Visit: Please schedule this patient for Postpartum visit in: 4 weeks with the following provider: Any provider In person For C/S patients schedule nurse incision check in weeks 2 weeks: no Low risk pregnancy complicated by: h/o shoulder dystocia Delivery mode:  SVD Anticipated Birth Control:  IUD at [facility] appt PP Procedures needed: IUD insertion  Schedule Integrated [place] visit: no  Newborn Data: Live born female  Birth Weight:  3586 g APGAR: 16, 9  Newborn Delivery   Birth date/time: 11/28/2019 06:57:00 Delivery type: Vaginal, Spontaneous      Baby Feeding: Bottle Disposition:home with mother   11/29/2019 Merilyn Baba, DO

## 2019-11-28 NOTE — Progress Notes (Signed)
Patient states she was positive for COVID on 09/29/2019.  According to [facility] policy and as discussed with house coverage, patient's COVID test was not repeated.  Patient is not symptomatic of any COVID symptoms.

## 2019-11-28 NOTE — Anesthesia Preprocedure Evaluation (Signed)
Anesthesia Evaluation  Patient identified by MRN, date of birth, ID band Patient awake    Reviewed: Allergy & Precautions, NPO status , Patient's Chart, lab work & pertinent test results  Airway Mallampati: II  TM Distance: >3 FB Neck ROM: Full    Dental no notable dental hx. (+) Teeth Intact   Pulmonary neg pulmonary ROS, former smoker,    Pulmonary exam normal breath sounds clear to auscultation       Cardiovascular negative cardio ROS Normal cardiovascular exam Rhythm:Regular Rate:Normal     Neuro/Psych negative neurological ROS  negative psych ROS   GI/Hepatic negative GI ROS, Neg liver ROS,   Endo/Other  negative endocrine ROS  Renal/GU negative Renal ROS     Musculoskeletal   Abdominal   Peds  Hematology Lab Results      Component                Value               Date                      WBC                      11.6 (H)            11/28/2019                HGB                      10.7 (L)            11/28/2019                HCT                      35.3 (L)            11/28/2019                MCV                      84.0                11/28/2019                PLT                      290                 11/28/2019              Anesthesia Other Findings   Reproductive/Obstetrics (+) Pregnancy                             Anesthesia Physical Anesthesia Plan  ASA: II  Anesthesia Plan: Epidural   Post-op Pain Management:    Induction:   PONV Risk Score and Plan:   Airway Management Planned:   Additional Equipment:   Intra-op Plan:   Post-operative Plan:   Informed Consent: I have reviewed the patients History and Physical, chart, labs and discussed the procedure including the risks, benefits and alternatives for the proposed anesthesia with the patient or authorized representative who has indicated his/her understanding and acceptance.       Plan Discussed  with:   Anesthesia Plan Comments: (39.1wk G2P1 for LEA)  Anesthesia Quick Evaluation  

## 2019-11-28 NOTE — Anesthesia Procedure Notes (Signed)
Epidural Patient location during procedure: OB Start time: 11/28/2019 4:25 AM End time: 11/28/2019 4:37 AM  Staffing Anesthesiologist: Trevor Iha, MD Performed: anesthesiologist   Preanesthetic Checklist Completed: patient identified, IV checked, site marked, risks and benefits discussed, surgical consent, monitors and equipment checked, pre-op evaluation and timeout performed  Epidural Patient position: sitting Prep: DuraPrep and site prepped and draped Patient monitoring: continuous pulse ox and blood pressure Approach: midline Location: L3-L4 Injection technique: LOR air  Needle:  Needle type: Tuohy  Needle gauge: 17 G Needle length: 9 cm and 9 Needle insertion depth: 4 cm Catheter type: closed end flexible Catheter size: 19 Gauge Catheter at skin depth: 10 cm Test dose: negative  Assessment Events: blood not aspirated, injection not painful, no injection resistance, no paresthesia and negative IV test  Additional Notes Patient identified. Risks/Benefits/Options discussed with patient including but not limited to bleeding, infection, nerve damage, paralysis, failed block, incomplete pain control, headache, blood pressure changes, nausea, vomiting, reactions to medication both or allergic, itching and postpartum back pain. Confirmed with bedside nurse the patient's most recent platelet count. Confirmed with patient that they are not currently taking any anticoagulation, have any bleeding history or any family history of bleeding disorders. Patient expressed understanding and wished to proceed. All questions were answered. Sterile technique was used throughout the entire procedure. Please see nursing notes for vital signs. Test dose was given through epidural needle and negative prior to continuing to dose epidural or start infusion. Warning signs of high block given to the patient including shortness of breath, tingling/numbness in hands, complete motor block, or any concerning  symptoms with instructions to call for help. Patient was given instructions on fall risk and not to get out of bed. All questions and concerns addressed with instructions to call with any issues.  1 Attempt (S) . Patient tolerated procedure well.

## 2019-11-28 NOTE — H&P (Addendum)
OBSTETRIC ADMISSION HISTORY AND PHYSICAL  Joyce Wilson is a 24 y.o. female G2P1001 with IUP at [redacted]w[redacted]d by U/S presenting for SOL. She reports +FMs, No LOF, no VB, no blurry vision, headaches or peripheral edema, and RUQ pain.  She plans on breast and bottle feeding. She request Postplacental IUD for birth control. She received her prenatal care at [facility]   Dating: By 6wk U/S --->  Estimated Date of Delivery: 12/04/19  Sono:   @[redacted]w[redacted]d , CWD, normal anatomy, cephalic presentation, 971g, 49% EFW  Prenatal History/Complications:  Bilateral choroid plexus cysts noted on early , resolved at 26 wks.   Past Medical History: Past Medical History:  Diagnosis Date  . Burning with urination 02/01/2015  . Chicken pox   . Chlamydia infection   . Dyspareunia   . Hematuria 02/01/2015  . Screening for STD (sexually transmitted disease) 02/01/2015  . Vaginal irritation 02/01/2015    Past Surgical History: Past Surgical History:  Procedure Laterality Date  . NO PAST SURGERIES      Obstetrical History: OB History    Gravida  2   Para  1   Term  1   Preterm  0   AB  0   Living  1     SAB  0   TAB  0   Ectopic  0   Multiple  0   Live Births  1           Social History Social History   Socioeconomic History  . Marital status: Single    Spouse name: Not on file  . Number of children: 1  . Years of education: Not on file  . Highest education level: High school graduate  Occupational History  . Not on file  Tobacco Use  . Smoking status: Former Smoker    Years: 1.00    Types: Cigarettes  . Smokeless tobacco: Never Used  Substance and Sexual Activity  . Alcohol use: No  . Drug use: No  . Sexual activity: Yes    Birth control/protection: None  Other Topics Concern  . Not on file  Social History Narrative  . Not on file   Social Determinants of Health   Financial Resource Strain: Low Risk   . Difficulty of Paying Living Expenses: Not hard at all  Food  Insecurity: No Food Insecurity  . Worried About 04/04/2015 in the Last Year: Never true  . Ran Out of Food in the Last Year: Never true  Transportation Needs: No Transportation Needs  . Lack of Transportation (Medical): No  . Lack of Transportation (Non-Medical): No  Physical Activity: Inactive  . Days of Exercise per Week: 0 days  . Minutes of Exercise per Session: 0 min  Stress: No Stress Concern Present  . Feeling of Stress : Only a little  Social Connections: Slightly Isolated  . Frequency of Communication with Friends and Family: More than three times a week  . Frequency of Social Gatherings with Friends and Family: Twice a week  . Attends Religious Services: More than 4 times per year  . Active Member of Clubs or Organizations: No  . Attends Programme researcher, broadcasting/film/video Meetings: Never  . Marital Status: Living with partner    Family History: Family History  Problem Relation Age of Onset  . Healthy Sister   . Diabetes Maternal Grandfather   . Hyperlipidemia Maternal Grandfather   . Heart disease Maternal Grandfather   . Hypertension Maternal Grandfather   . Healthy Mother   .  Healthy Father   . Heart disease Maternal Uncle     Allergies: Allergies  Allergen Reactions  . Penicillins     Has patient had a PCN reaction causing immediate rash, facial/tongue/throat swelling, SOB or lightheadedness with hypotension: unknown Has patient had a PCN reaction causing severe rash involving mucus membranes or skin necrosis: unknown Has patient had a PCN reaction that required hospitalization: no Has patient had a PCN reaction occurring within the last 10 years: no If all of the above answers are "NO", then may proceed with Cephalosporin use. Pt does not know what reaction she had     Medications Prior to Admission  Medication Sig Dispense Refill Last Dose  . cephALEXin (KEFLEX) 750 MG capsule Take 750 mg by mouth 4 (four) times daily.   11/27/2019 at Unknown time  . Prenatal  Vit-Fe Fumarate-FA (PRENATAL VITAMIN PO) Take by mouth.   11/27/2019 at Unknown time  . Blood Pressure Monitor MISC For regular home bp monitoring during pregnancy 1 each 0      Review of Systems   All systems reviewed and negative except as stated in HPI  Blood pressure 123/77, pulse (!) 113, temperature 98.6 F (37 C), resp. rate 19, weight 63.7 kg, last menstrual period 01/22/2019. General appearance: alert, cooperative and no distress Presentation: cephalic Fetal monitoring: 150/moderate/accels present, no decels Uterine activity: contracting spontaneously, every 2-3 minutes.  Dilation: 7.5 Effacement (%): 70 Station: -2 Exam by:: DCALLAWAY, RN   Prenatal labs: ABO, Rh: A/Positive/-- (10/26 1139) Antibody: Negative (02/01 0903) Rubella: 4.77 (10/26 1139) RPR: Non Reactive (02/01 0903)  HBsAg: Negative (10/26 1139)  HIV: Non Reactive (02/01 0903)  GBS: --Henderson Cloud (04/14 0215)  1 hr Glucola: wnl, 109 Genetic screening: normal Anatomy US: normal  Prenatal Transfer Tool  Maternal Diabetes: No Genetic Screening: Normal Maternal Ultrasounds/Referrals: Normal Fetal Ultrasounds or other Referrals:  None Maternal Substance Abuse:  No Significant Maternal Medications:  None Significant Maternal Lab Results: Group B Strep negative  No results found for this or any previous visit (from the past 24 hour(s)).  Patient Active Problem List   Diagnosis Date Noted  . Fetal choroid plexus cysts affecting antepartum care of mother 08/03/2019  . Supervision of normal pregnancy 05/25/2019    Assessment/Plan:  Joyce Wilson is a 24 y.o. G2P1001 at [redacted]w[redacted]d here for SOL.   #Labor: Expectant management.  #Pain: Per patient request. Requesting epidural at this time.  #FWB: Cat 1 #ID: GBS neg #MOF: Both #MOC: Postplacental IUD #Circ: Yes  Pearla Dubonnet, Medical Student  11/28/2019, 3:56 AM   GME ATTESTATION:  I saw and evaluated the patient. I agree with the findings and the  plan of care as documented in the student's note with addition of the following:  Patient desires post placental IUD, risks and benefits discussed, consent signed.  Merilyn Baba, DO OB Fellow, [facility] 11/28/2019 4:08 AM

## 2019-11-29 ENCOUNTER — Encounter (HOSPITAL_COMMUNITY): Payer: Self-pay | Admitting: Obstetrics and Gynecology

## 2019-11-29 LAB — CBC
HCT: 32.1 % — ABNORMAL LOW (ref 36.0–46.0)
Hemoglobin: 9.8 g/dL — ABNORMAL LOW (ref 12.0–15.0)
MCH: 25.5 pg — ABNORMAL LOW (ref 26.0–34.0)
MCHC: 30.5 g/dL (ref 30.0–36.0)
MCV: 83.6 fL (ref 80.0–100.0)
Platelets: 217 10*3/uL (ref 150–400)
RBC: 3.84 MIL/uL — ABNORMAL LOW (ref 3.87–5.11)
RDW: 14.3 % (ref 11.5–15.5)
WBC: 7.9 10*3/uL (ref 4.0–10.5)
nRBC: 0 % (ref 0.0–0.2)

## 2019-11-29 MED ORDER — IBUPROFEN 600 MG PO TABS: 600.0000 mg | ORAL_TABLET | Freq: Four times a day (QID) | ORAL | 0 refills | Status: DC

## 2019-11-29 MED ORDER — ACETAMINOPHEN 325 MG PO TABS: 650.0000 mg | ORAL_TABLET | ORAL | 0 refills | Status: DC | PRN

## 2019-11-29 MED ORDER — ONDANSETRON 4 MG PO TBDP: 4.0000 mg | ORAL_TABLET | Freq: Three times a day (TID) | ORAL | 0 refills | Status: DC | PRN

## 2019-11-29 NOTE — Lactation Note (Signed)
This note was copied from a baby's chart. Lactation Consultation Note:  Mother is a P2 infant is 59 hours old. Mother has had a difficult day with nausea.  Mother has mostly bottle fed today.  Mother was given a hand pump with instructions. Mother reports that she has a Medela pump at home.  She plans to start to hand express and to pump after bottle feeding. Encouraged mother to after formula feeding.  Discussed treatment and prevention of engorgement. Mother reports that she has a breast infection while breastfeeding the first child. She was advised to be aware that she was at risk. S/S of Mastitis reviewed.   Plan of Care : Breastfeed infant with feeding cues Supplement infant with ebm/formula, according to supplemental guidelines. Pump using a DEBP after each feeding for 15-20 mins.   Mother to continue to cue base feed infant and feed at least 8-12 times or more in 24 hours and advised to allow for cluster feeding infant as needed.   Mother to continue to due STS. Mother is aware of available LC services at [facility], BFSG'S, OP Dept, and phone # for questions or concerns about breastfeeding.  Mother receptive to all teaching and plan of care.    Patient Name: Joyce Wilson [id number] Date: 11/29/2019 Reason for consult: Follow-up assessment   Maternal Data    Feeding Feeding Type: Bottle Fed - Formula Nipple Type: Slow - flow  LATCH Score                   Interventions Interventions: Hand pump  Lactation Tools Discussed/Used     Consult Status Consult Status: Complete    Michel Bickers 11/29/2019, 3:57 PM

## 2019-12-04 ENCOUNTER — Inpatient Hospital Stay (HOSPITAL_COMMUNITY): Admission: RE | Admit: 2019-12-04 | Payer: Medicaid Other | Source: Home / Self Care

## 2019-12-04 ENCOUNTER — Telehealth: Payer: Self-pay | Admitting: Women's Health

## 2019-12-04 ENCOUNTER — Encounter: Payer: Self-pay | Admitting: *Deleted

## 2019-12-04 ENCOUNTER — Other Ambulatory Visit: Payer: Medicaid Other | Admitting: Obstetrics and Gynecology

## 2019-12-04 NOTE — Telephone Encounter (Signed)
Patient called stating that she would like a call back from the nurse regarding a medication. Please contact pt

## 2019-12-04 NOTE — Telephone Encounter (Signed)
MyChart message sent. JSY 

## 2019-12-04 NOTE — Telephone Encounter (Addendum)
Pt delivered Friday, May 1. Pt was given an antibiotic for sinus infection on April 30. Pt took antibiotic 1 day and then went into labor. Pt is breastfeeding and wanted to know if Keflex was safe to take while breastfeeding. Pt has thick green mucus and headache. Please advise. Thanks!! JSY

## 2019-12-08 ENCOUNTER — Encounter: Payer: Self-pay | Admitting: *Deleted

## 2019-12-08 ENCOUNTER — Ambulatory Visit: Payer: Medicaid Other | Admitting: Adult Health

## 2019-12-08 ENCOUNTER — Encounter: Payer: Self-pay | Admitting: Adult Health

## 2019-12-08 VITALS — BP 128/77 | HR 84 | Ht 63.0 in | Wt 126.0 lb

## 2019-12-08 DIAGNOSIS — N6459 Other signs and symptoms in breast: Secondary | ICD-10-CM | POA: Diagnosis not present

## 2019-12-08 NOTE — Progress Notes (Signed)
  Subjective:     Patient ID: Joyce Wilson, female   DOB: 06/13/1996, 24 y.o.   MRN: [id number]  HPI Joyce Wilson is a 24 year old white female, single, who had SVD 11/27/20, baby boy, she has been pumping, he won't latch on and she wants to stop, has good amount of milk, breasts tender, no redness or fever.   Review of Systems Has tender breasts, with some lumps now No fever Reviewed past medical,surgical, social and family history. Reviewed medications and allergies.     Objective:   Physical Exam BP 128/77 (BP Location: Left Arm, Patient Position: Sitting, Cuff Size: Normal)   Pulse 84   Ht 5\' 3"  (1.6 m)   Wt 126 lb (57.2 kg)   Breastfeeding No   BMI 22.32 kg/m   Skin warm and dry,  Breasts:no retraction, redness or heat, has bilateral firm areas and good milk flow from both nipples    Assessment:     1. Breast engorgement Need to bind breasts with really tight bra or ace wrap Stop pumping, do not let hot water hit breasts Use frozen peas, cold cabbage leaves, and tea bags as needed  Can alternate tylenol and advil or take combo pill     Plan:   Keep appt 6/7 with 8/7 for postpartum visit

## 2020-01-04 ENCOUNTER — Ambulatory Visit (INDEPENDENT_AMBULATORY_CARE_PROVIDER_SITE_OTHER): Payer: Medicaid Other | Admitting: Women's Health

## 2020-01-04 ENCOUNTER — Encounter: Payer: Self-pay | Admitting: Women's Health

## 2020-01-04 DIAGNOSIS — Z30430 Encounter for insertion of intrauterine contraceptive device: Secondary | ICD-10-CM

## 2020-01-04 MED ORDER — LEVONORGESTREL 19.5 MCG/DAY IU IUD
INTRAUTERINE_SYSTEM | Freq: Once | INTRAUTERINE | Status: AC
  Administered 2020-01-04: 1 via INTRAUTERINE

## 2020-01-04 NOTE — Patient Instructions (Signed)
 Nothing in vagina for 3 days (no sex, douching, tampons, etc...)  Check your strings once a month to make sure you can feel them, if you are not able to please let us know  If you develop a fever of 100.4 or more in the next few weeks, or if you develop severe abdominal pain, please let us know  Use a backup method of birth control, such as condoms, for 2 weeks     Intrauterine Device Insertion, Care After  This sheet gives you information about how to care for yourself after your procedure. Your health care provider may also give you more specific instructions. If you have problems or questions, contact your health care provider. What can I expect after the procedure? After the procedure, it is common to have:  Cramps and pain in the abdomen.  Light bleeding (spotting) or heavier bleeding that is like your menstrual period. This may last for up to a few days.  Lower back pain.  Dizziness.  Headaches.  Nausea. Follow these instructions at home:  Before resuming sexual activity, check to make sure that you can feel the IUD string(s). You should be able to feel the end of the string(s) below the opening of your cervix. If your IUD string is in place, you may resume sexual activity. ? If you had a hormonal IUD inserted more than 7 days after your most recent period started, you will need to use a backup method of birth control for 7 days after IUD insertion. Ask your health care provider whether this applies to you.  Continue to check that the IUD is still in place by feeling for the string(s) after every menstrual period, or once a month.  Take over-the-counter and prescription medicines only as told by your health care provider.  Do not drive or use heavy machinery while taking prescription pain medicine.  Keep all follow-up visits as told by your health care provider. This is important. Contact a health care provider if:  You have bleeding that is heavier or lasts longer  than a normal menstrual cycle.  You have a fever.  You have cramps or abdominal pain that get worse or do not get better with medicine.  You develop abdominal pain that is new or is not in the same area of earlier cramping and pain.  You feel lightheaded or weak.  You have abnormal or bad-smelling discharge from your vagina.  You have pain during sexual activity.  You have any of the following problems with your IUD string(s): ? The string bothers or hurts you or your sexual partner. ? You cannot feel the string. ? The string has gotten longer.  You can feel the IUD in your vagina.  You think you may be pregnant, or you miss your menstrual period.  You think you may have an STI (sexually transmitted infection). Get help right away if:  You have flu-like symptoms.  You have a fever and chills.  You can feel that your IUD has slipped out of place. Summary  After the procedure, it is common to have cramps and pain in the abdomen. It is also common to have light bleeding (spotting) or heavier bleeding that is like your menstrual period.  Continue to check that the IUD is still in place by feeling for the string(s) after every menstrual period, or once a month.  Keep all follow-up visits as told by your health care provider. This is important.  Contact your health care provider   if you have problems with your IUD string(s), such as the string getting longer or bothering you or your sexual partner. This information is not intended to replace advice given to you by your health care provider. Make sure you discuss any questions you have with your health care provider. Document Revised: 06/28/2017 Document Reviewed: 06/06/2016 Elsevier Patient Education  2020 [employer]  

## 2020-01-04 NOTE — Progress Notes (Signed)
POSTPARTUM VISIT Patient name: [name] MRN [id number]  Date of birth: 1995/09/11 Chief Complaint:   Postpartum Care  History of Present Illness:   Joyce Wilson is a 24 y.o. G58P2002 Caucasian female being seen today for a postpartum visit. She is 5 weeks postpartum following a spontaneous vaginal delivery at 39.1 gestational weeks. Anesthesia: epidural.  I have fully reviewed the prenatal and intrapartum course. Pregnancy uncomplicated. Postpartum course has been uncomplicated. Bleeding no bleeding. Bowel function is normal. Bladder function is normal.  Patient is not sexually active. Last sexual activity: prior to birth of baby.    No LMP recorded.  Baby's course has been uncomplicated. Baby is feeding by breast at first, now bottle    [place] Postpartum Depression Screening:  Edinburgh Postnatal Depression Scale - 01/04/20 1336      Edinburgh Postnatal Depression Scale:  In the Past 7 Days   I have been able to laugh and see the funny side of things.  0    I have looked forward with enjoyment to things.  0    I have blamed myself unnecessarily when things went wrong.  0    I have been anxious or worried for no good reason.  0    I have felt scared or panicky for no good reason.  0    Things have been getting on top of me.  0    I have been so unhappy that I have had difficulty sleeping.  0    I have felt sad or miserable.  0    I have been so unhappy that I have been crying.  0    The thought of harming myself has occurred to me.  0    Edinburgh Postnatal Depression Scale Total  0      Review of Systems:   Pertinent items are noted in HPI Denies Abnormal vaginal discharge w/ itching/odor/irritation, headaches, visual changes, shortness of breath, chest pain, abdominal pain, severe nausea/vomiting, or problems with urination or bowel movements. Pertinent History Reviewed:  Reviewed past medical,surgical, obstetrical and family history.  Reviewed problem list,  medications and allergies. OB History  Gravida Para Term Preterm AB Living  '2 2 2 ' 0 0 2  SAB TAB Ectopic Multiple Live Births  0 0 0 0 2    # Outcome Date GA Lbr Len/2nd Weight Sex Delivery Anes PTL Lv  2 Term 11/28/19 60w1d06:41 / 00:16 7 lb 14.5 oz (3.586 kg) M Vag-Spont EPI  LIV  1 Term 01/21/16 37w1d1:10 / 04:14 7 lb 1.2 oz (3.209 kg) F Vag-Spont EPI N LIV   Physical Assessment:   Vitals:   01/04/20 1334  BP: 125/81  Pulse: 86  Weight: 124 lb 8 oz (56.5 kg)  Height: '5\' 3"'  (1.6 m)  Body mass index is 22.05 kg/m.       Physical Examination:   General appearance: alert, well appearing, and in no distress  Mental status: alert, oriented to person, place, and time  Skin: warm & dry   Cardiovascular: normal heart rate noted   Respiratory: normal respiratory effort, no distress   Breasts: deferred, no complaints   Abdomen: soft, non-tender   Pelvic: VULVA: normal appearing vulva with no masses, tenderness or lesions, VAGINA: normal appearing vagina with normal color and discharge, no lesions, CERVIX: normal appearing cervix without discharge or lesions  Rectal: no hemorrhoids  Extremities: no edema       No results found for this or any previous visit (  from the past 24 hour(s)).   IUD INSERTION The risks and benefits of the method and placement have been thouroughly reviewed with the patient and all questions were answered.  Specifically the patient is aware of failure rate of 07/998, expulsion of the IUD and of possible perforation.  The patient is aware of irregular bleeding due to the method and understands the incidence of irregular bleeding diminishes with time.  Signed copy of informed consent in chart.  Time out was performed.  A graves speculum was placed in the vagina.  The cervix was visualized, prepped using Betadine, and grasped with a single tooth tenaculum. The uterus was found to be anteroflexed and it sounded to 9 cm.  Liletta  IUD placed per manufacturer's  recommendations. The strings were trimmed to approximately 3 cm. The patient tolerated the procedure well.   Informal transvaginal sonogram was performed and the proper placement of the IUD was verified.  Chaperone: [place]:  1) Postpartum exam 2) 5 wks s/p SVB 3) Bottlefeeding 4) Depression screening 5) Contraception counseling 6) Liletta IUD insertion The patient was given post procedure instructions, including signs and symptoms of infection and to check for the strings after each menses or each month, and refraining from intercourse or anything in the vagina for 3 days. She was given a care card with date IUD placed, and date IUD to be removed. She is scheduled for a f/u appointment in 4 weeks.  Essential components of care per ACOG recommendations:  1.  Mood and well being:  . Patient with negative depression screening today. . Pre-existing mental health disorders? No  . Patient does not use tobacco. If using tobacco we discussed reduction/cessation and risk of relapse . Substance use disorder? No    2. Infant care and feeding:  . Patient currently breastfeeding? No  . 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 Infant has a pediatrician/family doctor? Yes  . Recommended that all caregivers be immunized for flu, pertussis and other preventable communicable diseases . Pt does have material needs met such as stable housing, utilities, food and diapers. If not, referred to local resources for help obtaining these.  3. Sexuality, contraception and birth spacing . Provided guidance regarding sexuality, management of dyspareunia, and resumption of intercourse . Patient does not want a pregnancy in the near future.  Desired family size is 2 children.  . Discussed avoiding interpregnancy interval <37mhs and recommended birth spacing of 18 months . Reviewed forms of contraception. Patient desires IUD.    4.  Sleep and fatigue . Discussed coping options for fatigue and sleep disruption . Encouraged family/partner/community support of 4 hrs of uninterrupted sleep to help with mood and fatigue  5. Physical recovery  . Pt did not have a cesarean section. . Patient had a Rt labial and 1st degree perineal laceration, perineal healing and pain reviewed.  . Patient has urinary incontinence? No, fecal incontinence? No . Patient is safe to resume physical activity. Discussed attainment of healthy weight.  6.  Chronic disease management . Discussed pregnancy complications if any, and their implications for future childbearing and long-term maternal health. . Review recommendations for prevention of recurrent pregnancy complications, such as 17 hydroxyprogesterone caproate to reduce risk for recurrent PTB not applicable, or aspirin to reduce risk of preeclampsia not applicable. . Pt had GDM: No. If yes, 2hr GTT scheduled: not applicable. . Reviewed medications and non-pregnant dosing including  consideration of whether pt is breastfeeding using a reliable resource such as LactMed: not applicable . Referred for f/u w/ PCP or subspecialist providers as indicated: not applicable  7. Health maintenance . Last pap smear 05/25/19 and results were normal . Mammogram at 24yo or earlier if indicated  Meds:  Meds ordered this encounter  Medications  . levonorgestrel (LILETTA) 19.5 MCG/DAY IUD    Follow-up: Return in about 4 weeks (around 02/01/2020) for IUD F/U, in person, CNM.   No orders of the defined types were placed in this encounter.   [place], [facility] 01/04/2020 2:12 PM

## 2020-02-04 ENCOUNTER — Encounter: Payer: Self-pay | Admitting: Women's Health

## 2020-02-04 ENCOUNTER — Ambulatory Visit (INDEPENDENT_AMBULATORY_CARE_PROVIDER_SITE_OTHER): Payer: Medicaid Other | Admitting: Women's Health

## 2020-02-04 VITALS — BP 120/80 | HR 82 | Ht 63.0 in | Wt 126.0 lb

## 2020-02-04 DIAGNOSIS — Z30431 Encounter for routine checking of intrauterine contraceptive device: Secondary | ICD-10-CM

## 2020-02-04 DIAGNOSIS — F53 Postpartum depression: Secondary | ICD-10-CM | POA: Diagnosis not present

## 2020-02-04 DIAGNOSIS — O99345 Other mental disorders complicating the puerperium: Secondary | ICD-10-CM

## 2020-02-04 MED ORDER — SERTRALINE HCL 25 MG PO TABS: 25.0000 mg | ORAL_TABLET | Freq: Every day | ORAL | 6 refills | Status: DC

## 2020-02-04 NOTE — Progress Notes (Signed)
   GYN VISIT Patient name: Joyce Wilson MRN [id number]  Date of birth: Mar 16, 1996 Chief Complaint:   IUD Check ("mood changes")  History of Present Illness:   Joyce Wilson is a 24 y.o. G60P2002 Caucasian female being seen today for IUD check. Liletta IUD placed 01/04/20.   Doing well, no bleeding now, had cramping the other day. No pain w/ sex. Able to feel strings when checked for them. Has noticed mood change. Feels anxious, overwhelmed, easily irritated, sad for last few weeks. Not sure if it is IUD or being postpartum and having 2 children to care for. Denies SI/HI/II. No h/o PPD. EPDS on 6/7 at pp visit/IUD insert was 0, today is 15. Does want to keep the IUD for now and try medicine. Declines therapy right now.   No LMP recorded. (Menstrual status: IUD). The current method of family planning is IUD.  Last pap 05/25/19. Results were:  normal Review of Systems:   Pertinent items are noted in HPI Denies fever/chills, dizziness, headaches, visual disturbances, fatigue, shortness of breath, chest pain, abdominal pain, vomiting, abnormal vaginal discharge/itching/odor/irritation, problems with periods, bowel movements, urination, or intercourse unless otherwise stated above.  Pertinent History Reviewed:  Reviewed past medical,surgical, social, obstetrical and family history.  Reviewed problem list, medications and allergies. Physical Assessment:   Vitals:   02/04/20 1133  BP: 120/80  Pulse: 82  Weight: 126 lb (57.2 kg)  Height: 5\' 3"  (1.6 m)  Body mass index is 22.32 kg/m.       Physical Examination:   General appearance: alert, well appearing, and in no distress  Mental status: alert, oriented to person, place, and time  Skin: warm & dry   Cardiovascular: normal heart rate noted  Respiratory: normal respiratory effort, no distress  Abdomen: soft, non-tender   Pelvic: VULVA: normal appearing vulva with no masses, tenderness or lesions, VAGINA: normal appearing vagina with normal  color and discharge, no lesions, CERVIX: normal appearing cervix without discharge or lesions, IUD strings visible and appropriate length  Extremities: no edema   Chaperone: Peggy Dones    No results found for this or any previous visit (from the past 24 hour(s)).  Assessment & Plan:  1) IUD check> IUD in place  2) PPD/anxiety> unsure if r/t IUD or not, was fine prior to IUD. Wants to keep IUD for now and try meds, rx zoloft 25mg  daily, understands can take a few weeks to notice improvement. F/U in 4wks. Declines therapy.   Meds:  Meds ordered this encounter  Medications  . sertraline (ZOLOFT) 25 MG tablet    Sig: Take 1 tablet (25 mg total) by mouth daily.    Dispense:  30 tablet    Refill:  6    Order Specific Question:   Supervising Provider    Answer:   , LUTHER H [[id number]]    No orders of the defined types were placed in this encounter.   Return in about 4 weeks (around 03/03/2020) for med F/U, with me, in person.  Despina Hidden CNM, [facility] 02/04/2020 11:56 AM

## 2020-03-03 ENCOUNTER — Telehealth (INDEPENDENT_AMBULATORY_CARE_PROVIDER_SITE_OTHER): Payer: Medicaid Other | Admitting: Adult Health

## 2020-03-03 ENCOUNTER — Encounter: Payer: Self-pay | Admitting: Adult Health

## 2020-03-03 ENCOUNTER — Other Ambulatory Visit: Payer: Self-pay

## 2020-03-03 DIAGNOSIS — O99345 Other mental disorders complicating the puerperium: Secondary | ICD-10-CM

## 2020-03-03 DIAGNOSIS — F419 Anxiety disorder, unspecified: Secondary | ICD-10-CM | POA: Diagnosis not present

## 2020-03-03 DIAGNOSIS — F53 Postpartum depression: Secondary | ICD-10-CM | POA: Diagnosis not present

## 2020-03-03 MED ORDER — BUSPIRONE HCL 5 MG PO TABS: 5.0000 mg | ORAL_TABLET | Freq: Three times a day (TID) | ORAL | 1 refills | Status: DC

## 2020-03-03 NOTE — Progress Notes (Signed)
Patient ID: Joyce Wilson, female   DOB: 03/29/1996, 24 y.o.   MRN: [id number]   TELEHEALTH GYNECOLOGY VISIT ENCOUNTER NOTE  I connected with Joyce Wilson on 03/03/20 at 11:50 AM EDT by telephone at home and verified that I am speaking with the correct person using two identifiers.   I discussed the limitations, risks, security and privacy concerns of performing an evaluation and management service by telephone and the availability of in person appointments. I also discussed with the patient that there may be a patient responsible charge related to this service. The patient expressed understanding and agreed to proceed.   History:  Joyce Wilson is a 24 y.o. [phone] female being evaluated today for starting Zoloft 25 mg for postpartum depression and anxiety and she feels like depression is good but anxiety is not any better. She denies SI other concerns.       Past Medical History:  Diagnosis Date  . Burning with urination 02/01/2015  . Chicken pox   . Chlamydia infection   . Dyspareunia   . Hematuria 02/01/2015  . Screening for STD (sexually transmitted disease) 02/01/2015  . Vaginal irritation 02/01/2015   Past Surgical History:  Procedure Laterality Date  . NO PAST SURGERIES     The following portions of the patient's history were reviewed and updated as appropriate: allergies, current medications, past family history, past medical history, past social history, past surgical history and problem list.   Health Maintenance:  Normal pap 05/25/2019.  Review of Systems:  Pertinent items noted in HPI and remainder of comprehensive ROS otherwise negative.  Physical Exam:   General:  Alert, oriented and cooperative.   Mental Status: Normal mood and affect perceived. Normal judgment and thought content.  Physical exam deferred due to nature of the encounter PHQ 9 score is 4.  Labs and Imaging No results found for this or any previous visit (from the past 336 hour(s)). No results found.      Assessment and Plan:     1. Anxiety Will add buspar Meds ordered this encounter  Medications  . busPIRone (BUSPAR) 5 MG tablet    Sig: Take 1 tablet (5 mg total) by mouth 3 (three) times daily.    Dispense:  90 tablet    Refill:  1    Order Specific Question:   Supervising Provider    Answer:   Despina Hidden, LUTHER H [[id number]]    2. Postpartum depression/anxiety Continue Zoloft 25 mg 1 daily has refills  Follow up in about 6 weeks for tele visit with me or Selena Batten        I discussed the assessment and treatment plan with the patient. The patient was provided an opportunity to ask questions and all were answered. The patient agreed with the plan and demonstrated an understanding of the instructions.   The patient was advised to call back or seek an in-person evaluation/go to the ED if the symptoms worsen or if the condition fails to improve as anticipated.  I provided 8 minutes of non-face-to-face time during this encounter.   Cyril Mourning, NP [facility] [employer], [facility]

## 2020-04-14 ENCOUNTER — Telehealth: Payer: Medicaid Other | Admitting: Adult Health

## 2020-05-02 ENCOUNTER — Telehealth: Payer: Medicaid Other | Admitting: Adult Health

## 2020-09-16 ENCOUNTER — Other Ambulatory Visit (HOSPITAL_COMMUNITY)
Admission: RE | Admit: 2020-09-16 | Discharge: 2020-09-16 | Disposition: A | Discharge status: Home / Self Care | Payer: Medicaid Other | Source: Ambulatory Visit | Attending: Adult Health | Admitting: Adult Health

## 2020-09-16 ENCOUNTER — Ambulatory Visit (INDEPENDENT_AMBULATORY_CARE_PROVIDER_SITE_OTHER): Payer: Medicaid Other | Admitting: Adult Health

## 2020-09-16 ENCOUNTER — Encounter: Payer: Self-pay | Admitting: Adult Health

## 2020-09-16 ENCOUNTER — Other Ambulatory Visit: Payer: Self-pay

## 2020-09-16 VITALS — BP 119/82 | HR 97 | Ht 63.0 in | Wt 116.0 lb

## 2020-09-16 DIAGNOSIS — N9489 Other specified conditions associated with female genital organs and menstrual cycle: Secondary | ICD-10-CM | POA: Insufficient documentation

## 2020-09-16 DIAGNOSIS — Z975 Presence of (intrauterine) contraceptive device: Secondary | ICD-10-CM | POA: Insufficient documentation

## 2020-09-16 MED ORDER — DOXYCYCLINE HYCLATE 100 MG PO TABS: 100.0000 mg | ORAL_TABLET | Freq: Two times a day (BID) | ORAL | 0 refills | Status: DC

## 2020-09-16 NOTE — Progress Notes (Signed)
  Subjective:     Patient ID: Joyce Wilson, female   DOB: [birth year], 25 y.o.   MRN: [id number]  HPI Joyce Wilson is a 25 year old white female,single, G2P2 in complaining of cramping for about a month, now, has liletta IUD, that was palced 01/04/20, an wants it checked can feel one string. Has pressure with sex and feels like air pushed in. Last pap was 05/25/19 and was negative.  PCP is [facility].  Review of Systems +cramping +pressure with sex No new partners Has some constipation but no problems with urination Reviewed past medical,surgical, social and family history. Reviewed medications and allergies.     Objective:   Physical Exam BP 119/82 (BP Location: Left Arm, Patient Position: Sitting, Cuff Size: Normal)   Pulse 97   Ht 5\' 3"  (1.6 m)   Wt 116 lb (52.6 kg)   Breastfeeding No   BMI 20.55 kg/m  Skin warm and dry.Pelvic: external genitalia is normal in appearance no lesions, vagina: pink with good moisture,urethra has no lesions or masses noted, cervix is everted at os and I see 1 IUD string there is cloudy mucous at os, uterus: normal size, shape and contour, mildly tender, no masses felt, adnexa: no masses or tenderness noted. Bladder is non tender and no masses felt. CV Swab obtained POC shows IUD in place Fall risk is low  Upstream - 09/16/20 0913      Pregnancy Intention Screening   Does the patient want to become pregnant in the next year? No    Does the patient's partner want to become pregnant in the next year? No    Would the patient like to discuss contraceptive options today? No      Contraception Wrap Up   Current Method IUD or IUS    End Method IUD or IUS    Contraception Counseling Provided No         Examination chaperoned by 09/18/20 LPN    Assessment:     1. Uterine cramping CV swab sent Will rx doxycycline  Meds ordered this encounter  Medications  . doxycycline (VIBRA-TABS) 100 MG tablet    Sig: Take 1 tablet (100 mg total) by mouth 2 (two)  times daily.    Dispense:  14 tablet    Refill:  0    Order Specific Question:   Supervising Provider    Answer:   Malachy Mood, LUTHER H [[id number]]   No sex for 1 week  2. IUD (intrauterine device) in place     Plan:     Follow up with me in 3 weeks for ROS

## 2020-09-20 LAB — CERVICOVAGINAL ANCILLARY ONLY
Bacterial Vaginitis (gardnerella): NEGATIVE
Candida Glabrata: NEGATIVE
Candida Vaginitis: NEGATIVE
Chlamydia: NEGATIVE
Comment: NEGATIVE
Comment: NEGATIVE
Comment: NEGATIVE
Comment: NEGATIVE
Comment: NEGATIVE
Comment: NORMAL
Neisseria Gonorrhea: NEGATIVE
Trichomonas: NEGATIVE

## 2020-10-07 ENCOUNTER — Encounter: Payer: Self-pay | Admitting: Adult Health

## 2020-10-07 ENCOUNTER — Other Ambulatory Visit: Payer: Self-pay

## 2020-10-07 ENCOUNTER — Ambulatory Visit (INDEPENDENT_AMBULATORY_CARE_PROVIDER_SITE_OTHER): Payer: Medicaid Other | Admitting: Adult Health

## 2020-10-07 VITALS — BP 113/77 | HR 89 | Ht 63.0 in | Wt 115.0 lb

## 2020-10-07 DIAGNOSIS — R195 Other fecal abnormalities: Secondary | ICD-10-CM

## 2020-10-07 DIAGNOSIS — R109 Unspecified abdominal pain: Secondary | ICD-10-CM | POA: Diagnosis not present

## 2020-10-07 MED ORDER — DICYCLOMINE HCL 10 MG PO CAPS: 10.0000 mg | ORAL_CAPSULE | Freq: Three times a day (TID) | ORAL | 1 refills | Status: DC

## 2020-10-07 MED ORDER — DOCUSATE SODIUM 100 MG PO CAPS
100.0000 mg | ORAL_CAPSULE | Freq: Two times a day (BID) | ORAL | 99 refills | Status: DC
Start: 2020-10-07 — End: 2021-07-20

## 2020-10-07 NOTE — Progress Notes (Signed)
  Subjective:     Patient ID: Joyce Wilson, female   DOB: [birth year], 25 y.o.   MRN: [id number]  HPI Joyce Wilson is a 25 year old white female, single,G2P2 back in follow up after taking doxycycline and may have felt a little better while taking but still cramping. She is thinking may be bowel related now and not IUD related. PCP is [facility].   Review of Systems Still cramping, seems worse after eating Has BM daily Stool hard and looks like balls Reviewed past medical,surgical, social and family history. Reviewed medications and allergies.     Objective:   Physical Exam BP 113/77 (BP Location: Left Arm, Patient Position: Sitting, Cuff Size: Normal)   Pulse 89   Ht 5\' 3"  (1.6 m)   Wt 115 lb (52.2 kg)   BMI 20.37 kg/m   Talk only:    Will try stool softener and bentyl and see if feels better, and if not will refer to GI and she agrees with this.   Upstream - 10/07/20 1215      Pregnancy Intention Screening   Does the patient want to become pregnant in the next year? No    Does the patient's partner want to become pregnant in the next year? No    Would the patient like to discuss contraceptive options today? No      Contraception Wrap Up   Current Method IUD or IUS    End Method IUD or IUS    Contraception Counseling Provided No          Assessment:       1. Hard stool Try colace   2. Abdominal cramping Will try bentyl  Meds ordered this encounter  Medications  . docusate sodium (COLACE) 100 MG capsule    Sig: Take 1 capsule (100 mg total) by mouth 2 (two) times daily.    Dispense:  30 capsule    Refill:  prn    Order Specific Question:   Supervising Provider    Answer:   12/07/20, LUTHER H [[id number]]  . dicyclomine (BENTYL) 10 MG capsule    Sig: Take 1 capsule (10 mg total) by mouth 3 (three) times daily before meals.    Dispense:  90 capsule    Refill:  1    Order Specific Question:   Supervising Provider    Answer:   Despina Hidden [[id number]]      Plan:       Follow up  in 3 weeks, may need GI referral

## 2020-11-01 ENCOUNTER — Ambulatory Visit: Payer: Medicaid Other | Admitting: Adult Health

## 2020-11-14 ENCOUNTER — Other Ambulatory Visit: Payer: Self-pay | Admitting: Women's Health

## 2021-04-10 ENCOUNTER — Other Ambulatory Visit: Payer: Medicaid Other

## 2021-04-24 ENCOUNTER — Ambulatory Visit
Admission: EM | Admit: 2021-04-24 | Discharge: 2021-04-24 | Disposition: A | Discharge status: Home / Self Care | Payer: Medicaid Other | Attending: Physician Assistant | Admitting: Physician Assistant

## 2021-04-24 ENCOUNTER — Other Ambulatory Visit: Payer: Self-pay

## 2021-04-24 DIAGNOSIS — N390 Urinary tract infection, site not specified: Secondary | ICD-10-CM | POA: Diagnosis not present

## 2021-04-24 LAB — POCT URINALYSIS DIP (MANUAL ENTRY)
Bilirubin, UA: NEGATIVE
Glucose, UA: NEGATIVE mg/dL
Ketones, POC UA: NEGATIVE mg/dL
Nitrite, UA: NEGATIVE
Protein Ur, POC: 30 mg/dL — AB
Spec Grav, UA: 1.025 (ref 1.010–1.025)
Urobilinogen, UA: 1 E.U./dL
pH, UA: 7 (ref 5.0–8.0)

## 2021-04-24 MED ORDER — FLUCONAZOLE 150 MG PO TABS: ORAL_TABLET | ORAL | 0 refills | Status: DC

## 2021-04-24 MED ORDER — CEPHALEXIN 500 MG PO CAPS: 500.0000 mg | ORAL_CAPSULE | Freq: Four times a day (QID) | ORAL | 0 refills | Status: AC

## 2021-04-24 NOTE — ED Triage Notes (Signed)
Pt presents with complaints of dysuria and lower back pain. Concerned for uti.

## 2021-04-25 NOTE — ED Provider Notes (Signed)
[facility]    CSN: [id number] Arrival date & time: 04/24/21  1808      History   Chief Complaint Chief Complaint  Patient presents with   Dysuria    HPI Joyce Wilson is a 25 y.o. female.   The history is provided by the patient. No language interpreter was used.  Dysuria Pain quality:  Aching Pain severity:  Mild Onset quality:  Gradual Duration:  2 days Timing:  Constant Progression:  Worsening Chronicity:  New Recent urinary tract infections: no   Relieved by:  Nothing Worsened by:  Nothing Associated symptoms: no abdominal pain    Past Medical History:  Diagnosis Date   Burning with urination 02/01/2015   Chicken pox    Chlamydia infection    Dyspareunia    Hematuria 02/01/2015   Screening for STD (sexually transmitted disease) 02/01/2015   Vaginal irritation 02/01/2015    Patient Active Problem List   Diagnosis Date Noted   Abdominal cramping 10/07/2020   Hard stool 10/07/2020   IUD (intrauterine device) in place 09/16/2020   Uterine cramping 09/16/2020   Postpartum depression/anxiety 02/04/2020   Encounter for IUD insertion 01/04/2020   Breast engorgement 12/08/2019    Past Surgical History:  Procedure Laterality Date   NO PAST SURGERIES      OB History     Gravida  2   Para  2   Term  2   Preterm  0   AB  0   Living  2      SAB  0   IAB  0   Ectopic  0   Multiple  0   Live Births  2            Home Medications    Prior to Admission medications   Medication Sig Start Date End Date Taking? Authorizing Provider  cephALEXin (KEFLEX) 500 MG capsule Take 1 capsule (500 mg total) by mouth 4 (four) times daily for 7 days. 04/24/21 05/01/21 Yes Elson Areas, PA-C  fluconazole (DIFLUCAN) 150 MG tablet One tablet if symptoms, repeat after antibiotic 04/24/21  Yes Cheron Schaumann K, PA-C  busPIRone (BUSPAR) 5 MG tablet Take 1 tablet (5 mg total) by mouth 3 (three) times daily. Patient taking differently: Take 5 mg by  mouth. Takes a few times a week 03/03/20   Cyril Mourning A, NP  dicyclomine (BENTYL) 10 MG capsule Take 1 capsule (10 mg total) by mouth 3 (three) times daily before meals. 10/07/20   Adline Potter, NP  docusate sodium (COLACE) 100 MG capsule Take 1 capsule (100 mg total) by mouth 2 (two) times daily. 10/07/20   Adline Potter, NP  levonorgestrel (LILETTA, 52 MG,) 19.5 MCG/DAY IUD IUD 1 each by Intrauterine route once.    [provider]  sertraline (ZOLOFT) 25 MG tablet TAKE 1 TABLET BY MOUTH ONCE DAILY. 11/14/20   Cheral Marker, CNM    Family History Family History  Problem Relation Age of Onset   Healthy Sister    Diabetes Maternal Grandfather    Hyperlipidemia Maternal Grandfather    Heart disease Maternal Grandfather    Hypertension Maternal Grandfather    Healthy Mother    Healthy Father    Heart disease Maternal Uncle     Social History Social History   Tobacco Use   Smoking status: Former    Years: 1.00    Types: Cigarettes   Smokeless tobacco: Never  Vaping Use   Vaping Use: Never  used  Substance Use Topics   Alcohol use: No   Drug use: No     Allergies   Penicillins   Review of Systems Review of Systems  Gastrointestinal:  Negative for abdominal pain.  Genitourinary:  Positive for dysuria.  All other systems reviewed and are negative.   Physical Exam Triage Vital Signs ED Triage Vitals  Enc Vitals Group     BP 04/24/21 1953 121/78     Pulse Rate 04/24/21 1953 63     Resp 04/24/21 1953 19     Temp 04/24/21 1953 98.7 F (37.1 C)     Temp src --      SpO2 04/24/21 1953 98 %     Weight --      Height --      Head Circumference --      Peak Flow --      Pain Score 04/24/21 1952 5     Pain Loc --      Pain Edu? --      Excl. in GC? --    No data found.  Updated Vital Signs BP 121/78   Pulse 63   Temp 98.7 F (37.1 C)   Resp 19   SpO2 98%   Visual Acuity Right Eye Distance:   Left Eye Distance:   Bilateral  Distance:    Right Eye Near:   Left Eye Near:    Bilateral Near:     Physical Exam Vitals reviewed.  Constitutional:      Appearance: Normal appearance.  Cardiovascular:     Rate and Rhythm: Normal rate.  Pulmonary:     Effort: Pulmonary effort is normal.  Abdominal:     General: Abdomen is flat.  Neurological:     General: No focal deficit present.     Mental Status: She is alert.  Psychiatric:        Mood and Affect: Mood normal.     UC Treatments / Results  Labs (all labs ordered are listed, but only abnormal results are displayed) Labs Reviewed  POCT URINALYSIS DIP (MANUAL ENTRY) - Abnormal; Notable for the following components:      Result Value   Color, UA light yellow (*)    Clarity, UA cloudy (*)    Blood, UA trace-intact (*)    Protein Ur, POC =30 (*)    Leukocytes, UA Small (1+) (*)    All other components within normal limits    EKG   Radiology No results found.  Procedures Procedures (including critical care time)  Medications Ordered in UC Medications - No data to display  Initial Impression / Assessment and Plan / UC Course  I have reviewed the triage vital signs and the nursing notes.  Pertinent labs & imaging results that were available during my care of the patient were reviewed by me and considered in my medical decision making (see chart for details).     MDM Final Clinical Impressions(s) / UC Diagnoses   Final diagnoses:  Urinary tract infection without hematuria, site unspecified   Discharge Instructions   None    ED Prescriptions     Medication Sig Dispense Auth. Provider   cephALEXin (KEFLEX) 500 MG capsule Take 1 capsule (500 mg total) by mouth 4 (four) times daily for 7 days. 28 capsule Tarik Teixeira K, [place]   fluconazole (DIFLUCAN) 150 MG tablet One tablet if symptoms, repeat after antibiotic 2 tablet Elson Areas, [place]      PDMP not reviewed this  encounter. An After Visit Summary was printed and given to the  patient.    Elson Areas, [place] 04/25/21 1349

## 2021-07-20 ENCOUNTER — Encounter: Payer: Self-pay | Admitting: Adult Health

## 2021-07-20 ENCOUNTER — Ambulatory Visit (INDEPENDENT_AMBULATORY_CARE_PROVIDER_SITE_OTHER): Payer: Medicaid Other | Admitting: Adult Health

## 2021-07-20 ENCOUNTER — Other Ambulatory Visit: Payer: Self-pay

## 2021-07-20 VITALS — BP 132/87 | HR 110 | Ht 63.0 in | Wt 119.5 lb

## 2021-07-20 DIAGNOSIS — R109 Unspecified abdominal pain: Secondary | ICD-10-CM | POA: Diagnosis not present

## 2021-07-20 DIAGNOSIS — K5900 Constipation, unspecified: Secondary | ICD-10-CM | POA: Diagnosis not present

## 2021-07-20 DIAGNOSIS — Z975 Presence of (intrauterine) contraceptive device: Secondary | ICD-10-CM | POA: Diagnosis not present

## 2021-07-20 MED ORDER — DICYCLOMINE HCL 10 MG PO CAPS: 10.0000 mg | ORAL_CAPSULE | Freq: Three times a day (TID) | ORAL | 1 refills | Status: DC

## 2021-07-20 MED ORDER — DOCUSATE SODIUM 100 MG PO CAPS: 100.0000 mg | ORAL_CAPSULE | Freq: Two times a day (BID) | ORAL | 99 refills | Status: DC

## 2021-07-20 NOTE — Progress Notes (Signed)
°  Subjective:     Patient ID: Joyce Wilson, female   DOB: 17-Jul-1996, 25 y.o.   MRN: [id number]  HPI Joyce Wilson is a 25 year old white female,single, G2P2 in complaining of abdominal cramping esp at night after eating,and constipation and wants IUD checked. Usually no period with IUD but has some bleeding for 2 days few weeks ago. She had stopped bentyl and colace.  PCP is [facility].  Lab Results  Component Value Date   DIAGPAP  05/25/2019    - Negative for intraepithelial lesion or malignancy (NILM)    Review of Systems Abdominal cramping Constipation Bleeding for 2 days, wants IUD checked Reviewed past medical,surgical, social and family history. Reviewed medications and allergies.     Objective:   Physical Exam BP 132/87 (BP Location: Left Arm, Patient Position: Sitting, Cuff Size: Normal)    Pulse (!) 110    Ht 5\' 3"  (1.6 m)    Wt 119 lb 8 oz (54.2 kg)    LMP 07/06/2021 (Approximate)    Breastfeeding No    BMI 21.17 kg/m     Skin warm and dry.Pelvic: external genitalia is normal in appearance no lesions, vagina: pink and moist,urethra has no lesions or masses noted, cervix:bulbous, +IUD strings at os, uterus: normal size, shape and contour, non tender, no masses felt, adnexa: no masses,LLQ  tenderness noted over bowel.  Bladder is non tender and no masses felt.  Fall risk is low  Upstream - 07/20/21 1011       Pregnancy Intention Screening   Does the patient want to become pregnant in the next year? No    Does the patient's partner want to become pregnant in the next year? No    Would the patient like to discuss contraceptive options today? No      Contraception Wrap Up   Current Method IUD or IUS    End Method IUD or IUS    Contraception Counseling Provided No            Examination chaperoned by 07/22/21 LPN  Assessment:     1. Abdominal cramping Will refill bentyl and colace and refer to GI - Ambulatory referral to Gastroenterology Meds ordered this encounter   Medications   docusate sodium (COLACE) 100 MG capsule    Sig: Take 1 capsule (100 mg total) by mouth 2 (two) times daily.    Dispense:  30 capsule    Refill:  prn    Order Specific Question:   Supervising Provider    Answer:   Malachy Mood H [[id number]]   dicyclomine (BENTYL) 10 MG capsule    Sig: Take 1 capsule (10 mg total) by mouth 3 (three) times daily before meals.    Dispense:  90 capsule    Refill:  1    Order Specific Question:   Supervising Provider    Answer:   Duane Lope, LUTHER H [[id number]]    2. IUD (intrauterine device) in place   3. Constipation, unspecified constipation type Resume colace and refer to GI - Ambulatory referral to Gastroenterology     Plan:     Follow up in about 10 months for pap and physical

## 2021-08-30 ENCOUNTER — Ambulatory Visit: Payer: Medicaid Other | Admitting: Internal Medicine

## 2021-09-07 ENCOUNTER — Encounter: Payer: Self-pay | Admitting: Internal Medicine

## 2021-09-07 ENCOUNTER — Other Ambulatory Visit: Payer: Self-pay

## 2021-09-07 ENCOUNTER — Ambulatory Visit (INDEPENDENT_AMBULATORY_CARE_PROVIDER_SITE_OTHER): Payer: Medicaid Other | Admitting: Internal Medicine

## 2021-09-07 VITALS — BP 123/75 | HR 91 | Temp 97.5°F | Ht 63.0 in | Wt 121.6 lb

## 2021-09-07 DIAGNOSIS — R1032 Left lower quadrant pain: Secondary | ICD-10-CM

## 2021-09-07 DIAGNOSIS — K5900 Constipation, unspecified: Secondary | ICD-10-CM | POA: Diagnosis not present

## 2021-09-07 NOTE — Patient Instructions (Signed)
For your constipation, I want you to start taking over the counter MiraLAX 1 capful daily.  If this does not adequately control your constipation, I would increase to 2 capfuls daily.  If this is still not adequate, then I would add on once daily Dulcolax (bisacodyl) tablet.   I also recommend increasing fiber in your diet or adding OTC Benefiber/Metamucil. Be sure to drink at least 4 to 6 glasses of water daily.   You can continue to take the dicyclomine as needed if you feel like this is helping.  If the over-the-counter medications do not improve your bowel movements then let us know and I will give you samples of Linzess.  If you continue to have abdominal pain despite improvement in your constipation, we may need to consider further evaluation with colonoscopy and/or cross-sectional imaging such as CT scan.  Follow-up with GI in 2 to 3 months.  It was very nice meeting you today.  Dr. Abbey Chatters  At [facility] we value your feedback. You may receive a survey about your visit today. Please share your experience as we strive to create trusting relationships with our patients to provide genuine, compassionate, quality care.  We appreciate your understanding and patience as we review any laboratory studies, imaging, and other diagnostic tests that are ordered as we care for you. Our office policy is 5 business days for review of these results, and any emergent or urgent results are addressed in a timely manner for your best interest. If you do not hear from our office in 1 week, please contact us.   We also encourage the use of MyChart, which contains your medical information for your review as well. If you are not enrolled in this feature, an access code is on this after visit summary for your convenience. Thank you for allowing [name] to be involved in your care.  It was great to see you today!  I hope you have a great rest of your Winter!    Elon Alas. Abbey Chatters,  D.O. Gastroenterology and Hepatology [facility]

## 2021-09-07 NOTE — Progress Notes (Signed)
Primary Care Physician:  [facility] Primary Gastroenterologist:  Dr. Marletta Lor  Chief Complaint  Patient presents with   Constipation    Most of GI issues starting after having son 2 years ago. Doesn't feel like she completely empties bowels. Constipation getting worse   Abdominal Cramping    Bentyl didn't help cramping.   Nausea    And vomiting    HPI:   Joyce Wilson is a 26 y.o. female who presents to clinic today by referral from her OB/GYN Joyce Wilson for evaluation.  She has multiple GI complaints for me today.  She notes since having her son approximately 2 years ago she has had lower GI issues.  Notes chronic constipation.  States she will have a bowel movement every 2 to 3 days.  Does note some incomplete emptying.  Occasional straining.  No melena hematochezia.  Also with left lower quadrant abdominal pain.  Pain is intermittent in nature, does not radiate, crampy at times.  Does not improve after having a bowel movement.  Has trialed dicyclomine, probiotics, Colace without improvement in her symptoms.  Also states she took MiraLAX for a day without improvement.  No family history of colorectal malignancy.  No unintentional weight loss.  No upper GI symptoms including heartburn, reflux, dysphagia, odynophagia, epigastric/chest pain.  Past Medical History:  Diagnosis Date   Burning with urination 02/01/2015   Chicken pox    Chlamydia infection    Dyspareunia    Hematuria 02/01/2015   Screening for STD (sexually transmitted disease) 02/01/2015   Vaginal irritation 02/01/2015    Past Surgical History:  Procedure Laterality Date   NO PAST SURGERIES      Current Outpatient Medications  Medication Sig Dispense Refill   levonorgestrel (LILETTA, 52 MG,) 19.5 MCG/DAY IUD IUD 1 each by Intrauterine route once.     Probiotic Product (PROBIOTIC DAILY PO) Take by mouth daily.     dicyclomine (BENTYL) 10 MG capsule Take 1 capsule (10 mg total) by mouth 3  (three) times daily before meals. (Patient not taking: Reported on 09/07/2021) 90 capsule 1   docusate sodium (COLACE) 100 MG capsule Take 1 capsule (100 mg total) by mouth 2 (two) times daily. (Patient not taking: Reported on 09/07/2021) 30 capsule prn   sertraline (ZOLOFT) 25 MG tablet TAKE 1 TABLET BY MOUTH ONCE DAILY. (Patient not taking: Reported on 09/07/2021) 30 tablet 11   No current facility-administered medications for this visit.    Allergies as of 09/07/2021 - Review Complete 09/07/2021  Allergen Reaction Noted   Penicillins  07/29/2014    Family History  Problem Relation Age of Onset   Healthy Sister    Diabetes Maternal Grandfather    Hyperlipidemia Maternal Grandfather    Heart disease Maternal Grandfather    Hypertension Maternal Grandfather    Healthy Mother    Healthy Father    Heart disease Maternal Uncle     Social History   Socioeconomic History   Marital status: Single    Spouse name: Not on file   Number of children: 1   Years of education: Not on file   Highest education level: High school graduate  Occupational History   Not on file  Tobacco Use   Smoking status: Every Day    Types: E-cigarettes   Smokeless tobacco: Never   Tobacco comments:    vapes  Vaping Use   Vaping Use: Every day  Substance and Sexual Activity   Alcohol use: Yes  Comment: occ   Drug use: No   Sexual activity: Yes    Birth control/protection: I.U.D.  Other Topics Concern   Not on file  Social History Narrative   Not on file   Social Determinants of Health   Financial Resource Strain: Not on file  Food Insecurity: Not on file  Transportation Needs: Not on file  Physical Activity: Not on file  Stress: Not on file  Social Connections: Not on file  Intimate Partner Violence: Not on file    Subjective: Review of Systems  Constitutional:  Negative for chills and fever.  HENT:  Negative for congestion and hearing loss.   Eyes:  Negative for blurred vision and  double vision.  Respiratory:  Negative for cough and shortness of breath.   Cardiovascular:  Negative for chest pain and palpitations.  Gastrointestinal:  Positive for abdominal pain and constipation. Negative for blood in stool, diarrhea, heartburn, melena and vomiting.  Genitourinary:  Negative for dysuria and urgency.  Musculoskeletal:  Negative for joint pain and myalgias.  Skin:  Negative for itching and rash.  Neurological:  Negative for dizziness and headaches.  Psychiatric/Behavioral:  Negative for depression. The patient is not nervous/anxious.       Objective: BP 123/75    Pulse 91    Temp (!) 97.5 F (36.4 C) (Temporal)    Ht  (1.6 m)    Wt 121 lb 9.6 oz (55.2 kg)    Breastfeeding No    BMI 21.54 kg/m  Physical Exam Constitutional:      Appearance: Normal appearance.  HENT:     Head: Normocephalic and atraumatic.  Eyes:     Extraocular Movements: Extraocular movements intact.     Conjunctiva/sclera: Conjunctivae normal.  Cardiovascular:     Rate and Rhythm: Normal rate and regular rhythm.  Pulmonary:     Effort: Pulmonary effort is normal.     Breath sounds: Normal breath sounds.  Abdominal:     General: Bowel sounds are normal.     Palpations: Abdomen is soft.  Musculoskeletal:        General: No swelling. Normal range of motion.     Cervical back: Normal range of motion and neck supple.  Skin:    General: Skin is warm and dry.     Coloration: Skin is not jaundiced.  Neurological:     General: No focal deficit present.     Mental Status: She is alert and oriented to person, place, and time.  Psychiatric:        Mood and Affect: Mood normal.        Behavior: Behavior normal.     Assessment: *Left lower quadrant abdominal pain *Constipation  Plan: Discussed patient's symptoms in depth with her today.  Possible irritable bowel syndrome constipation predominant.  For patient's constipation, I recommended taking MiraLAX 1 capful daily.  If this is not  adequate then increase to 2 capfuls daily.  If this is still not adequate then add on once daily Dulcolax.  Also recommended patient start taking fiber therapy. Encouraged to drink at least 6 glasses of water daily.  If symptoms not improved on over-the-counter remedies, will give samples of Linzess.  If her abdominal pain does not improve after getting her bowels moving, may need to consider cross-sectional imaging such as CT scan and/or endoscopic evaluation with colonoscopy.  Patient to follow-up in 2 to 3 months.  Thank you Joyce Wilson for the kind referral.   09/07/2021 2:14 PM  Disclaimer: This note was dictated with voice recognition software. Similar sounding words can inadvertently be transcribed and may not be corrected upon review.

## 2021-09-26 ENCOUNTER — Telehealth: Payer: Self-pay | Admitting: Internal Medicine

## 2021-09-26 DIAGNOSIS — R1032 Left lower quadrant pain: Secondary | ICD-10-CM

## 2021-09-26 NOTE — Telephone Encounter (Signed)
Patient called and said she has been using the mitilax but is still having abdominal pain.  Dr Abbey Chatters said that if the pain continued that they would schedule her for a ct or colonoscopy.  Please advise

## 2021-09-27 NOTE — Telephone Encounter (Signed)
Called and spoke with patient.  Bowels are moving better on MiraLAX and fiber supplementation.  Continues to have left lower quadrant pain.  Can we set her up for CT abdomen pelvis with contrast to further evaluate?  Thank you ?

## 2021-09-27 NOTE — Telephone Encounter (Signed)
Pt states that the miralax is working, she no longer has constipation. Pt is still having abdominal pain in the left lower side. Pt is also having some nausea with occasional vomiting after she eats a big meal.  ?

## 2021-09-28 NOTE — Addendum Note (Signed)
Addended by: Corrie Mckusick on: 09/28/2021 08:09 AM ? ? Modules accepted: Orders ? ?

## 2021-09-28 NOTE — Telephone Encounter (Signed)
Called pt, she is ok with scheduling CT abd/pelvis at [facility] since [facility] is booked out for several weeks. ? ?CT abd/pelvis w/contrast scheduled for 10/02/21 at 9:30am, arrive at 9:15am. NPO 4 hours prior to test. She may pickup contrast at any [facility] location. Drink 1st bottle of contrast at 7:30am and 2nd bottle at 8:30am. ? ?Called pt back, informed her of CT appt and details. Appt letter sent to her via MyChart. ?

## 2021-09-28 NOTE — Telephone Encounter (Signed)
PA for CT abd/pelvis w/contrast submitted via [facility] website. Order ID: [id number], valid 09/28/21-11/26/21. ?

## 2021-10-02 ENCOUNTER — Telehealth: Payer: Self-pay | Admitting: Internal Medicine

## 2021-10-02 ENCOUNTER — Encounter (HOSPITAL_BASED_OUTPATIENT_CLINIC_OR_DEPARTMENT_OTHER): Payer: Self-pay

## 2021-10-02 ENCOUNTER — Other Ambulatory Visit: Payer: Self-pay

## 2021-10-02 ENCOUNTER — Ambulatory Visit (HOSPITAL_BASED_OUTPATIENT_CLINIC_OR_DEPARTMENT_OTHER)
Admission: RE | Admit: 2021-10-02 | Discharge: 2021-10-02 | Disposition: A | Discharge status: Home / Self Care | Payer: Medicaid Other | Source: Ambulatory Visit | Attending: Internal Medicine | Admitting: Internal Medicine

## 2021-10-02 DIAGNOSIS — R1032 Left lower quadrant pain: Secondary | ICD-10-CM

## 2021-10-02 MED ORDER — IOHEXOL 300 MG/ML  SOLN
100.0000 mL | Freq: Once | INTRAMUSCULAR | Status: AC | PRN
  Administered 2021-10-02: 65 mL via INTRAVENOUS

## 2021-10-02 NOTE — Telephone Encounter (Signed)
The CT department called and they need a signed order for her CT this morning and also asked for the pregnancy test results. Fax [phone] ?

## 2021-10-02 NOTE — Telephone Encounter (Addendum)
Message sent to Dr. Marletta Lor to sign order. ?Called [facility] and spoke with [place]. Aware. Also aware pt does not have any orders for preg test. If needed, I can place an order. She will call back to let know. ?

## 2021-10-05 NOTE — Telephone Encounter (Signed)
Noted  

## 2021-10-19 ENCOUNTER — Telehealth: Payer: Self-pay | Admitting: Internal Medicine

## 2021-10-19 NOTE — Telephone Encounter (Signed)
Pt is calling regarding her results from 3-06. Please advise ?

## 2021-10-19 NOTE — Telephone Encounter (Signed)
Patient called about her ct results  ?

## 2021-11-20 ENCOUNTER — Encounter: Payer: Self-pay | Admitting: Internal Medicine

## 2021-12-01 ENCOUNTER — Ambulatory Visit: Payer: Medicaid Other | Admitting: Adult Health

## 2022-01-01 ENCOUNTER — Encounter: Payer: Self-pay | Admitting: Internal Medicine

## 2022-02-02 ENCOUNTER — Telehealth: Payer: Self-pay | Admitting: Internal Medicine

## 2022-02-02 NOTE — Telephone Encounter (Signed)
I called patient to remind her of her OV with Dr Marletta Lor on Wednesday next week. She said to cancel it because she has been waiting since March to hear from [name] about her CT results she had done. I told her that I would let the nurse be aware and I would cancel her OV. [phone]

## 2022-02-02 NOTE — Telephone Encounter (Signed)
Pt had a CT done March 6th and she has not received her results. Please advise

## 2022-02-05 NOTE — Telephone Encounter (Signed)
Phoned and LMOVM for pt to return call 

## 2022-02-05 NOTE — Telephone Encounter (Signed)
Patient's CT scan was normal.  Everything looks great.  No findings to explain patient's abdominal pain.  I am not sure how this got lost in the mix.

## 2022-02-07 ENCOUNTER — Ambulatory Visit: Payer: Medicaid Other | Admitting: Internal Medicine

## 2022-02-07 NOTE — Telephone Encounter (Signed)
Lmom for pt to return call. 

## 2022-02-08 NOTE — Telephone Encounter (Signed)
Result letter mailed to the pt. 

## 2022-05-10 ENCOUNTER — Ambulatory Visit: Payer: Medicaid Other | Admitting: Adult Health

## 2022-10-29 ENCOUNTER — Encounter: Payer: Self-pay | Admitting: Advanced Practice Midwife

## 2022-10-29 ENCOUNTER — Ambulatory Visit (INDEPENDENT_AMBULATORY_CARE_PROVIDER_SITE_OTHER): Payer: Medicaid Other | Admitting: Advanced Practice Midwife

## 2022-10-29 ENCOUNTER — Other Ambulatory Visit (HOSPITAL_COMMUNITY)
Admission: RE | Admit: 2022-10-29 | Discharge: 2022-10-29 | Disposition: A | Discharge status: Home / Self Care | Payer: Medicaid Other | Source: Ambulatory Visit | Attending: Advanced Practice Midwife | Admitting: Advanced Practice Midwife

## 2022-10-29 VITALS — BP 121/79 | HR 77 | Ht 63.0 in | Wt 124.0 lb

## 2022-10-29 DIAGNOSIS — Z113 Encounter for screening for infections with a predominantly sexual mode of transmission: Secondary | ICD-10-CM

## 2022-10-29 DIAGNOSIS — Z01419 Encounter for gynecological examination (general) (routine) without abnormal findings: Secondary | ICD-10-CM | POA: Diagnosis not present

## 2022-10-29 MED ORDER — CLINDAMYCIN HCL 300 MG PO CAPS: 300.0000 mg | ORAL_CAPSULE | Freq: Three times a day (TID) | ORAL | 0 refills | Status: DC

## 2022-10-29 MED ORDER — FLUCONAZOLE 150 MG PO TABS
ORAL_TABLET | ORAL | 2 refills | Status: DC
Start: 2022-10-29 — End: 2022-11-05

## 2022-10-29 MED ORDER — DOXYCYCLINE HYCLATE 100 MG PO CAPS: 100.0000 mg | ORAL_CAPSULE | Freq: Two times a day (BID) | ORAL | 0 refills | Status: DC

## 2022-10-29 NOTE — Progress Notes (Addendum)
[place] Mastrogiovanni 27 y.o.  Vitals:   10/29/22 1117  BP: 121/79  Pulse: 77     Filed Weights   10/29/22 1117  Weight: 124 lb (56.2 kg)    Past Medical History: Past Medical History:  Diagnosis Date   Burning with urination 02/01/2015   Chicken pox    Chlamydia infection    Dyspareunia    Hematuria 02/01/2015   Screening for STD (sexually transmitted disease) 02/01/2015   Vaginal irritation 02/01/2015    Past Surgical History: Past Surgical History:  Procedure Laterality Date   NO PAST SURGERIES      Family History: Family History  Problem Relation Age of Onset   Healthy Sister    Diabetes Maternal Grandfather    Hyperlipidemia Maternal Grandfather    Heart disease Maternal Grandfather    Hypertension Maternal Grandfather    Healthy Mother    Healthy Father    Heart disease Maternal Uncle     Social History: Social History   Tobacco Use   Smoking status: Every Day    Types: E-cigarettes   Smokeless tobacco: Never   Tobacco comments:    vapes  Vaping Use   Vaping Use: Every day  Substance Use Topics   Alcohol use: Yes    Comment: occ   Drug use: No    Allergies:  Allergies  Allergen Reactions   Penicillins     Has patient had a PCN reaction causing immediate rash, facial/tongue/throat swelling, SOB or lightheadedness with hypotension: unknown Has patient had a PCN reaction causing severe rash involving mucus membranes or skin necrosis: unknown Has patient had a PCN reaction that required hospitalization: no Has patient had a PCN reaction occurring within the last 10 years: no If all of the above answers are "NO", then may proceed with Cephalosporin use. Pt does not know what reaction she had       Current Outpatient Medications:    levonorgestrel (LILETTA, 52 MG,) 19.5 MCG/DAY IUD IUD, 1 each by Intrauterine route once., Disp: , Rfl:    predniSONE (STERAPRED UNI-PAK 21 TAB) 5 MG (21) TBPK tablet, Take 5 mg by mouth daily., Disp: , Rfl:   History of  Present Illness: Her for pap and physical.  Uses Liletta IUD for birth control. Periods are rare, light.  Has had "odor" and an "off" heavier DC for about a week.  No irritation or itching. Had CHL a few months ago, both she and her partner took meds, didn't have POC. Wants STD testing. Has noticed several freckles pop up on her vulva over the past year. Does use a tanning be at beginning of summer season. Also has a "skin tag" that is irritated by her clothes.  Would like it removed.   Prior cytology:      Component Value Date/Time   DIAGPAP  05/25/2019 1040    - Negative for intraepithelial lesion or malignancy (NILM)   ADEQPAP  05/25/2019 1040    Satisfactory for evaluation; transformation zone component PRESENT.       Review of Systems   Patient denies any headaches, blurred vision, shortness of breath, chest pain, abdominal pain, problems with bowel movements, urination, or intercourse.   Physical Exam: General:  Well developed, well nourished, no acute distress Skin:  Warm and dry Neck:  Midline trachea, normal thyroid Lungs; Clear to auscultation bilaterally Breast:  No dominant palpable mass, retraction, or nipple discharge Cardiovascular: Regular rate and rhythm Abdomen:  Soft, non tender, no hepatosplenomegaly Pelvic:  External genitalia  is normal in appearance, but there are 4-5 what appear to be freckles/moles, not raised.  Small skin tag on left lower vulva/buttock area.  About a cm above it, there is a mole that is raised, looks diffferent than the other ones.  The vagina is normal in appearance.DC looks pretty normal, no appreciable odor. Wet prep WBC only.  Had one single flagellating cell, good sample. Anderson Malta looked, couldn't find anything other than WBCs.  The cervix is bulbous. IUD strings visible.  Uterus is felt to be normal size, shape, and contour.  No adnexal masses or tenderness noted.  Extremities:  No swelling or varicosities noted Psych:  No mood changes.      Impression: Normal GYN exam  Vulvar skin tag and slightly suspicious mole.  F/U for tag removal, consider removing both     Plan: If pap normal, repeat in 3 years Meds ordered this encounter  Medications   DISCONTD: clindamycin (CLEOCIN) 300 MG capsule    Sig: Take 1 capsule (300 mg total) by mouth 3 (three) times daily.    Dispense:  21 capsule    Refill:  0    Order Specific Question:   Supervising Provider    Answer:   Elonda Husky, LUTHER H [[id number]]   fluconazole (DIFLUCAN) 150 MG tablet    Sig: 1 po stat; repeat in 3 days    Dispense:  2 tablet    Refill:  2    Order Specific Question:   Supervising Provider    Answer:   Tania Ade H [[id number]]   doxycycline (VIBRAMYCIN) 100 MG capsule    Sig: Take 1 capsule (100 mg total) by mouth 2 (two) times daily.    Dispense:  14 capsule    Refill:  0    Disregard the clindamycin prescription, it was done in error    Order Specific Question:   Supervising Provider    Answer:   Florian Buff [[id number]]   Orders Placed This Encounter  Procedures   HIV Antibody (routine testing w rflx)   RPR

## 2022-10-30 LAB — CYTOLOGY - PAP
Chlamydia: NEGATIVE
Comment: NEGATIVE
Comment: NEGATIVE
Comment: NEGATIVE
Comment: NEGATIVE
Comment: NORMAL
Diagnosis: HIGH — AB
HPV 16: POSITIVE — AB
HPV 18 / 45: NEGATIVE
High risk HPV: POSITIVE — AB
Neisseria Gonorrhea: NEGATIVE

## 2022-10-30 LAB — HIV ANTIBODY (ROUTINE TESTING W REFLEX): HIV Screen 4th Generation wRfx: NONREACTIVE

## 2022-10-30 LAB — RPR: RPR Ser Ql: NONREACTIVE

## 2022-11-01 ENCOUNTER — Encounter: Payer: Self-pay | Admitting: Advanced Practice Midwife

## 2022-11-01 DIAGNOSIS — R87619 Unspecified abnormal cytological findings in specimens from cervix uteri: Secondary | ICD-10-CM | POA: Insufficient documentation

## 2022-11-05 ENCOUNTER — Encounter: Payer: Self-pay | Admitting: Obstetrics & Gynecology

## 2022-11-05 ENCOUNTER — Ambulatory Visit (INDEPENDENT_AMBULATORY_CARE_PROVIDER_SITE_OTHER): Payer: Medicaid Other | Admitting: Obstetrics & Gynecology

## 2022-11-05 ENCOUNTER — Other Ambulatory Visit (HOSPITAL_COMMUNITY)
Admission: RE | Admit: 2022-11-05 | Discharge: 2022-11-05 | Disposition: A | Discharge status: Home / Self Care | Payer: Medicaid Other | Source: Ambulatory Visit | Attending: Obstetrics & Gynecology | Admitting: Obstetrics & Gynecology

## 2022-11-05 VITALS — BP 122/82 | HR 81 | Ht 63.0 in | Wt 124.0 lb

## 2022-11-05 DIAGNOSIS — R87613 High grade squamous intraepithelial lesion on cytologic smear of cervix (HGSIL): Secondary | ICD-10-CM | POA: Insufficient documentation

## 2022-11-05 DIAGNOSIS — R87810 Cervical high risk human papillomavirus (HPV) DNA test positive: Secondary | ICD-10-CM | POA: Insufficient documentation

## 2022-11-05 NOTE — Addendum Note (Signed)
Addended by: Moss Mc on: 11/05/2022 12:53 PM   Modules accepted: Orders

## 2022-11-05 NOTE — Progress Notes (Signed)
    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  Speculum placed in vagina and excellent visualization of cervix achieved, cervix swabbed x 3 with acetic acid solution.  Findings: Adequate colposcopy is noted today.  Cervix: adequate colposcopy wide extension columnar epithelium; dense AWE changes circumferentially of the columnar epithelium Vaginal inspection: normal without visible lesions. Vulvar colposcopy: vulvar colposcopy not performed.  Monsel's used  Specimens: x1  Complications: none.  Colposcopic Impression:   Plan(Based on 2019 ASCCP recommendations) Specimens labelled and sent to Pathology. Will base further treatment on Pathology findings.

## 2022-11-07 LAB — SURGICAL PATHOLOGY

## 2022-11-08 ENCOUNTER — Encounter: Payer: Self-pay | Admitting: Obstetrics & Gynecology

## 2022-11-08 ENCOUNTER — Telehealth (INDEPENDENT_AMBULATORY_CARE_PROVIDER_SITE_OTHER): Payer: Medicaid Other | Admitting: Obstetrics & Gynecology

## 2022-11-08 DIAGNOSIS — R87613 High grade squamous intraepithelial lesion on cytologic smear of cervix (HGSIL): Secondary | ICD-10-CM

## 2022-11-08 NOTE — Progress Notes (Signed)
Telephone visit: for results We could not get MyChart to work properly so we did a phone call Time 10 minutes   Follow up appointment for results: Cervical biopsy  Chief Complaint  Patient presents with   Discuss colpo results    There were no vitals taken for this visit.  Recent Results (from the past 2160 hour(s))  Cytology - PAP( [facility])     Status: Abnormal   Collection Time: 10/29/22 11:10 AM  Result Value Ref Range   High risk HPV Positive (A)    Chlamydia Negative    Neisseria Gonorrhea Negative    HPV 16 Positive (A)    HPV 18 / 45 Negative    Adequacy      Satisfactory for evaluation; transformation zone component PRESENT.   Diagnosis (A)     - High grade squamous intraepithelial lesion (HSIL)   Comment Normal Reference Range HPV - Negative    Comment Normal Reference Ranger Chlamydia - Negative    Comment      Normal Reference Range Neisseria Gonorrhea - Negative   Comment Normal Reference Range HPV 16- Negative    Comment Normal Reference Range HPV 16 18 45 -Negative   HIV Antibody (routine testing w rflx)     Status: None   Collection Time: 10/29/22 12:15 PM  Result Value Ref Range   HIV Screen 4th Generation wRfx Non Reactive Non Reactive    Comment: HIV-1/HIV-2 antibodies and HIV-1 p24 antigen were NOT detected. There is no laboratory evidence of HIV infection. HIV Negative   RPR     Status: None   Collection Time: 10/29/22 12:15 PM  Result Value Ref Range   RPR Ser Ql Non Reactive Non Reactive  Surgical pathology     Status: None   Collection Time: 11/05/22 12:50 PM  Result Value Ref Range   SURGICAL PATHOLOGY      SURGICAL PATHOLOGY CASE: [id number] PATIENT: Joyce Wilson Surgical Pathology Report     Clinical History: HGSIL, HPV type 16 DNA detected (cm)     FINAL MICROSCOPIC DIAGNOSIS:  A. 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 See Comment.  COMMENT:  - Appropriately controlled p16  immunohistochemical stain shows block like positivity and supports the above interpretation.  GROSS DESCRIPTION:  A. Received in formalin labeled with the patients name and DOB is a 0.6 cm piece of tan soft tissue, submitted in toto in a single cassette.  (LEF 11/05/2022)   Final Diagnosis performed by Clifton James, MD.   Electronically signed 11/07/2022 Technical component performed at [facility]. [facility], [address], [place], [place] [address].  Professional component performed at [facility], [address], [place], [place] [address].  Immunohistochemistry Technical component (if applicable) was perform ed at [facility]. [address], [address], [place], [place] [address].   IMMUNOHISTOCHEMISTRY DISCLAIMER (if applicable): Some of these immunohistochemical stains may have been developed and the performance characteristics determine by [facility]. Some may not have been cleared or approved by the U.S. Food and Drug Administration. The FDA has determined that such clearance or approval is not necessary. This test is used for clinical purposes. It should not be regarded as investigational or for research. This laboratory is certified under the Clinical Laboratory Improvement Amendments of 1988 (CLIA-88) as qualified to perform high complexity clinical laboratory testing.  The controls stained appropriately.      MEDS ordered this encounter: No orders of the defined types were  placed in this encounter.   Orders for this encounter: No orders of the defined types were placed in this encounter.   Impression + Management Plan   ICD-10-CM   1. 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     All questions were answered.  Past Medical History:   Diagnosis Date   Burning with urination 02/01/2015   Chicken pox    Chlamydia infection    Dyspareunia    Hematuria 02/01/2015   Screening for STD (sexually transmitted disease) 02/01/2015   Vaginal irritation 02/01/2015   Vaginal Pap smear, abnormal     Past Surgical History:  Procedure Laterality Date   NO PAST SURGERIES      OB History     Gravida  2   Para  2   Term  2   Preterm  0   AB  0   Living  2      SAB  0   IAB  0   Ectopic  0   Multiple  0   Live Births  2           Allergies  Allergen Reactions   Penicillins     Has patient had a PCN reaction causing immediate rash, facial/tongue/throat swelling, SOB or lightheadedness with hypotension: unknown Has patient had a PCN reaction causing severe rash involving mucus membranes or skin necrosis: unknown Has patient had a PCN reaction that required hospitalization: no Has patient had a PCN reaction occurring within the last 10 years: no If all of the above answers are "NO", then may proceed with Cephalosporin use. Pt does not know what reaction she had     Social History   Socioeconomic History   Marital status: Single    Spouse name: Not on file   Number of children: 1   Years of education: Not on file   Highest education level: High school graduate  Occupational History   Not on file  Tobacco Use   Smoking status: Every Day    Types: E-cigarettes   Smokeless tobacco: Never   Tobacco comments:    vapes  Vaping Use   Vaping Use: Every day  Substance and Sexual Activity   Alcohol use: Yes    Comment: occ   Drug use: No   Sexual activity: Yes    Birth control/protection: I.U.D.  Other Topics Concern   Not on file  Social History Narrative   Not on file   Social Determinants of Health   Financial Resource Strain: Low Risk  (10/29/2022)   Overall Financial Resource Strain (CARDIA)    Difficulty of Paying Living Expenses: Not hard at all  Food Insecurity: No Food Insecurity  (10/29/2022)   Hunger Vital Sign    Worried About Running Out of Food in the Last Year: Never true    Ran Out of Food in the Last Year: Never true  Transportation Needs: No Transportation Needs (10/29/2022)   PRAPARE - Administrator, Civil Service (Medical): No    Lack of Transportation (Non-Medical): No  Physical Activity: Insufficiently Active (10/29/2022)   Exercise Vital Sign    Days of Exercise per Week: 7 days    Minutes of Exercise per Session: 20 min  Stress: Stress Concern Present (10/29/2022)   [employer] of Occupational Health - Occupational Stress Questionnaire  Feeling of Stress : To some extent  Social Connections: Moderately Isolated (10/29/2022)   Social Connection and Isolation Panel [NHANES]    Frequency of Communication with Friends and Family: More than three times a week    Frequency of Social Gatherings with Friends and Family: Three times a week    Attends Religious Services: More than 4 times per year    Active Member of Clubs or Organizations: No    Attends BankerClub or Organization Meetings: Never    Marital Status: Never married    Family History  Problem Relation Age of Onset   Healthy Sister    Diabetes Maternal Grandfather    Hyperlipidemia Maternal Grandfather    Heart disease Maternal Grandfather    Hypertension Maternal Grandfather    Healthy Mother    Healthy Father    Heart disease Maternal Uncle

## 2022-11-13 ENCOUNTER — Encounter: Payer: Self-pay | Admitting: Obstetrics & Gynecology

## 2022-11-30 ENCOUNTER — Other Ambulatory Visit: Payer: Self-pay | Admitting: Obstetrics & Gynecology

## 2022-11-30 DIAGNOSIS — Z01818 Encounter for other preprocedural examination: Secondary | ICD-10-CM

## 2022-11-30 NOTE — Patient Instructions (Signed)
Joyce Wilson  11/30/2022     @PREFPERIOPPHARMACY @   Your procedure is scheduled on  12/05/2022.   Report to [facility] at  0830 A.M.   Call this number if you have problems the morning of surgery:  [phone]  If you experience any cold or flu symptoms such as cough, fever, chills, shortness of breath, etc. between now and your scheduled surgery, please notify us at the above number.   Remember:  Do not eat after midnight.    You may drink clear liquids until 0630 am on 12/05/2022.     Clear liquids allowed are:                    Water, Juice (No red color; non-citric and without pulp; diabetics please choose diet or no sugar options), Carbonated beverages (diabetics please choose diet or no sugar options), Clear Tea (No creamer, milk, or cream, including half & half and powdered creamer), Black Coffee Only (No creamer, milk or cream, including half & half and powdered creamer), Plain Jell-O Only (No red color; diabetics please choose no sugar options), Clear Sports drink (No red color; diabetics please choose diet or no sugar options), and Plain Popsicles Only (No red color; diabetics please choose no sugar options)            At 0630 am on 12/05/2022 drink your carb drink. You can have nothing else to drink after this.    Take these medicines the morning of surgery with A SIP OF WATER                                            None.     Do not wear jewelry, make-up or nail polish.  Do not wear lotions, powders, or perfumes, or deodorant.  Do not shave 48 hours prior to surgery.  Men may shave face and neck.  Do not bring valuables to the hospital.  [facility] is not responsible for any belongings or valuables.  Contacts, dentures or bridgework may not be worn into surgery.  Leave your suitcase in the car.  After surgery it may be brought to your room.  For patients admitted to the hospital, discharge time will be determined by your treatment team.  Patients  discharged the day of surgery will not be allowed to drive home and must have someone with them for 24 hours.    Special instructions:   DO NOT smoke tobacco or vape for 24 hours before your procedure.  Please read over the following fact sheets that you were given. Pain Booklet, Coughing and Deep Breathing, Surgical Site Infection Prevention, Anesthesia Post-op Instructions, and Care and Recovery After Surgery      Cervical Laser Surgery, Care After After cervical laser surgery, it is common to have: Pain or discomfort. Mild cramping. Bleeding, spotting, or brownish discharge from your vagina. Follow these instructions at home: Activity  Rest as told by your health care provider. Return to your normal activities as told by you provider. Ask your provider what activities are safe for you. Do not have sex until your provider says it is okay. General instructions Take over-the-counter and prescription medicines only as told by your provider. Ask your provider if the medicine prescribed to you requires you to avoid driving or using machinery. Wear menstrual pads to absorb any  bleeding, spotting, and discharge. Do not put anything into your vagina, including tampons or douche, until your provider says it is okay. It is up to you to get the results of your procedure. Ask your provider, or the department that is doing the procedure, when your results will be ready. Your provider may give you more instructions. Make sure you know what you can and cannot do. Contact a health care provider if: Your pain or cramping does not improve. Your periods are more painful than usual. You do not get your period as expected. Get help right away if: You have any symptoms of infection, such as: A fever. Chills. Discharge that smells bad. You have severe pain in your lower abdomen. You have heavy bleeding from your vagina. A sign of heavy bleeding is that you need to use more than one pad per  hour. You have vaginal bleeding with clumps of blood (blood clots). This information is not intended to replace advice given to you by your health care provider. Make sure you discuss any questions you have with your health care provider. Document Revised: 03/27/2022 Document Reviewed: 03/27/2022 Elsevier Patient Education  2023 Elsevier Inc. General Anesthesia, Adult, Care After The following information offers guidance on how to care for yourself after your procedure. Your health care provider may also give you more specific instructions. If you have problems or questions, contact your health care provider. What can I expect after the procedure? After the procedure, it is common for people to: Have pain or discomfort at the IV site. Have nausea or vomiting. Have a sore throat or hoarseness. Have trouble concentrating. Feel cold or chills. Feel weak, sleepy, or tired (fatigue). Have soreness and body aches. These can affect parts of the body that were not involved in surgery. Follow these instructions at home: For the time period you were told by your health care provider:  Rest. Do not participate in activities where you could fall or become injured. Do not drive or use machinery. Do not drink alcohol. Do not take sleeping pills or medicines that cause drowsiness. Do not make important decisions or sign legal documents. Do not take care of children on your own. General instructions Drink enough fluid to keep your urine pale yellow. If you have sleep apnea, surgery and certain medicines can increase your risk for breathing problems. Follow instructions from your health care provider about wearing your sleep device: Anytime you are sleeping, including during daytime naps. While taking prescription pain medicines, sleeping medicines, or medicines that make you drowsy. Return to your normal activities as told by your health care provider. Ask your health care provider what activities  are safe for you. Take over-the-counter and prescription medicines only as told by your health care provider. Do not use any products that contain nicotine or tobacco. These products include cigarettes, chewing tobacco, and vaping devices, such as e-cigarettes. These can delay incision healing after surgery. If you need help quitting, ask your health care provider. Contact a health care provider if: You have nausea or vomiting that does not get better with medicine. You vomit every time you eat or drink. You have pain that does not get better with medicine. You cannot urinate or have bloody urine. You develop a skin rash. You have a fever. Get help right away if: You have trouble breathing. You have chest pain. You vomit blood. These symptoms may be an emergency. Get help right away. Call 911. Do not wait to see if the symptoms  will go away. Do not drive yourself to the hospital. Summary After the procedure, it is common to have a sore throat, hoarseness, nausea, vomiting, or to feel weak, sleepy, or fatigue. For the time period you were told by your health care provider, do not drive or use machinery. Get help right away if you have difficulty breathing, have chest pain, or vomit blood. These symptoms may be an emergency. This information is not intended to replace advice given to you by your health care provider. Make sure you discuss any questions you have with your health care provider. Document Revised: 10/13/2021 Document Reviewed: 10/13/2021 Elsevier Patient Education  2023 [employer].

## 2022-12-03 ENCOUNTER — Encounter (HOSPITAL_COMMUNITY)
Admission: RE | Admit: 2022-12-03 | Discharge: 2022-12-03 | Disposition: A | Discharge status: Home / Self Care | Payer: Medicaid Other | Source: Ambulatory Visit | Attending: Obstetrics & Gynecology | Admitting: Obstetrics & Gynecology

## 2022-12-03 ENCOUNTER — Encounter (HOSPITAL_COMMUNITY): Payer: Self-pay

## 2022-12-03 DIAGNOSIS — Z01812 Encounter for preprocedural laboratory examination: Secondary | ICD-10-CM | POA: Diagnosis present

## 2022-12-03 DIAGNOSIS — Z01818 Encounter for other preprocedural examination: Secondary | ICD-10-CM

## 2022-12-03 LAB — COMPREHENSIVE METABOLIC PANEL
ALT: 19 U/L (ref 0–44)
AST: 16 U/L (ref 15–41)
Albumin: 4.1 g/dL (ref 3.5–5.0)
Alkaline Phosphatase: 41 U/L (ref 38–126)
Anion gap: 8 (ref 5–15)
BUN: 11 mg/dL (ref 6–20)
CO2: 24 mmol/L (ref 22–32)
Calcium: 9.1 mg/dL (ref 8.9–10.3)
Chloride: 102 mmol/L (ref 98–111)
Creatinine, Ser: 0.62 mg/dL (ref 0.44–1.00)
GFR, Estimated: >60 mL/min (ref >60)
Glucose, Bld: 93 mg/dL (ref 70–99)
Potassium: 3.3 mmol/L — ABNORMAL LOW (ref 3.5–5.1)
Sodium: 134 mmol/L — ABNORMAL LOW (ref 135–145)
Total Bilirubin: 1.3 mg/dL — ABNORMAL HIGH (ref 0.3–1.2)
Total Protein: 7.1 g/dL (ref 6.5–8.1)

## 2022-12-03 LAB — TYPE AND SCREEN
ABO/RH(D): A POS
Antibody Screen: NEGATIVE

## 2022-12-03 LAB — URINALYSIS, ROUTINE W REFLEX MICROSCOPIC
Bacteria, UA: NONE SEEN
Bilirubin Urine: NEGATIVE
Glucose, UA: NEGATIVE mg/dL
Hgb urine dipstick: NEGATIVE
Ketones, ur: NEGATIVE mg/dL
Nitrite: NEGATIVE
Protein, ur: NEGATIVE mg/dL
Specific Gravity, Urine: 1.023 (ref 1.005–1.030)
pH: 7 (ref 5.0–8.0)

## 2022-12-03 LAB — RAPID HIV SCREEN (HIV 1/2 AB+AG)
HIV 1/2 Antibodies: NONREACTIVE
HIV-1 P24 Antigen - HIV24: NONREACTIVE

## 2022-12-03 LAB — CBC
HCT: 41.6 % (ref 36.0–46.0)
Hemoglobin: 14.1 g/dL (ref 12.0–15.0)
MCH: 30.7 pg (ref 26.0–34.0)
MCHC: 33.9 g/dL (ref 30.0–36.0)
MCV: 90.6 fL (ref 80.0–100.0)
Platelets: 322 10*3/uL (ref 150–400)
RBC: 4.59 MIL/uL (ref 3.87–5.11)
RDW: 11.8 % (ref 11.5–15.5)
WBC: 8.9 10*3/uL (ref 4.0–10.5)
nRBC: 0 % (ref 0.0–0.2)

## 2022-12-03 LAB — POCT PREGNANCY, URINE: Preg Test, Ur: NEGATIVE

## 2022-12-04 IMAGING — CT CT ABD-PELV W/ CM
2 of 4 series · 17 of 46 positions shown, 19 images · IV contrast (APPLIED)
Comparison: None.

CLINICAL DATA: Left lower quadrant abdominal pain

EXAM:
CT ABDOMEN AND PELVIS WITH CONTRAST
TECHNIQUE: Multidetector CT imaging of the abdomen and pelvis was performed
using the standard protocol following bolus administration of
intravenous contrast.

[Series 2: abd pel w · axial · 0.61mm/px · z∈[+782,+1192]mm · 14 of 90 slices shown, 16 images]
[im 4/90  soft-tissue]
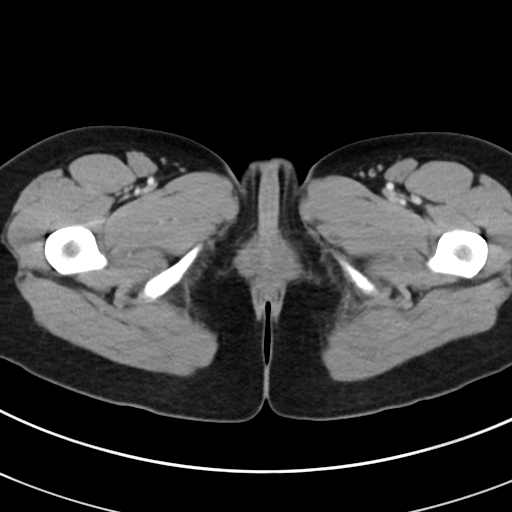
[im 4/90  bone]
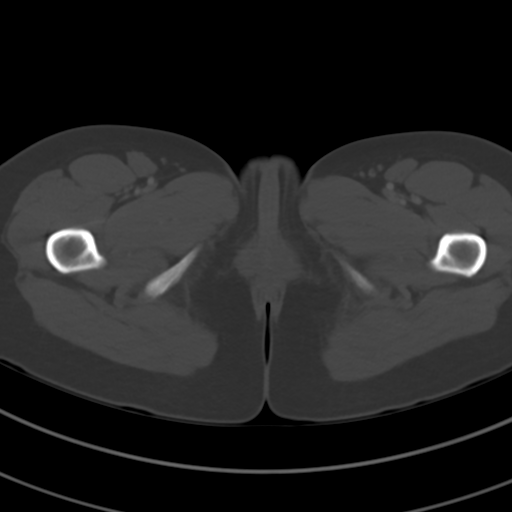
[im 11/90  soft-tissue]
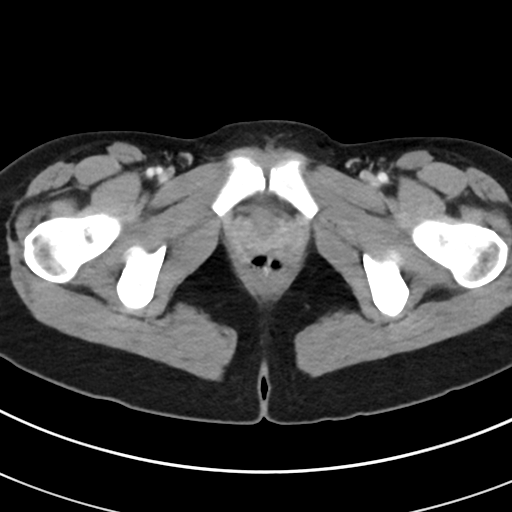
[im 18/90  soft-tissue]
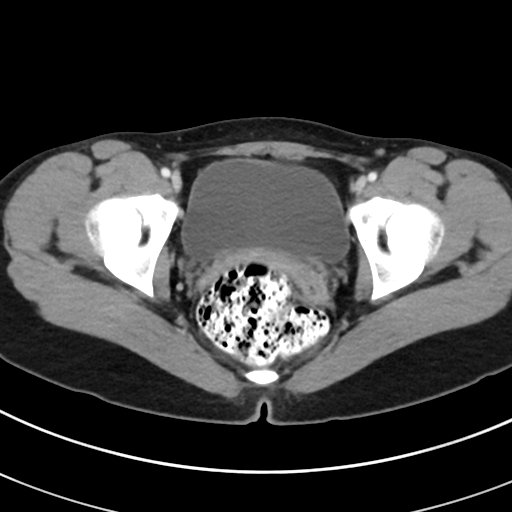
[im 25/90  soft-tissue]
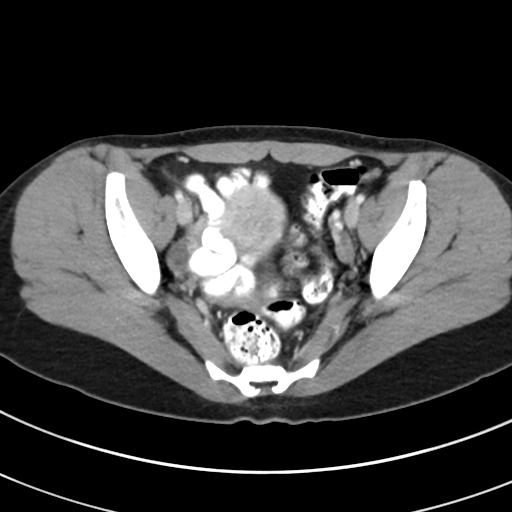
[im 29/90  soft-tissue]
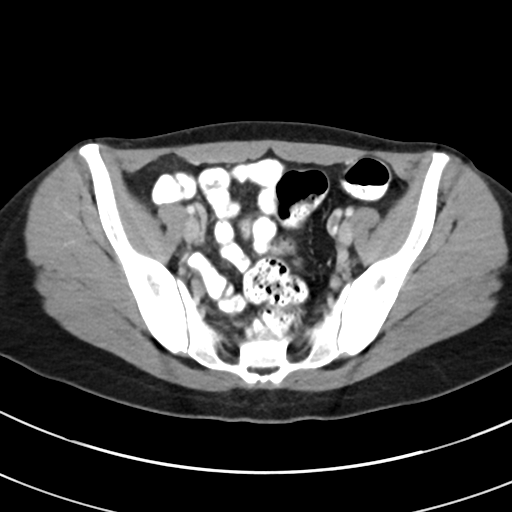
[im 36/90  soft-tissue]
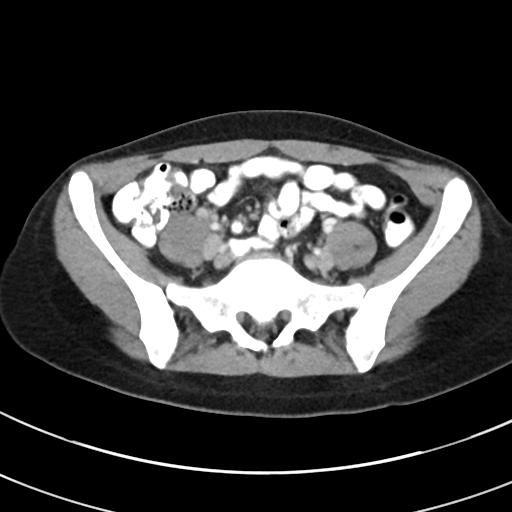
[im 43/90  soft-tissue]
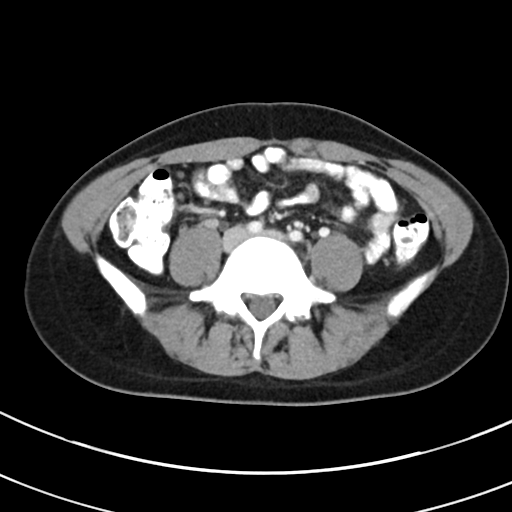
[im 47/90  soft-tissue]
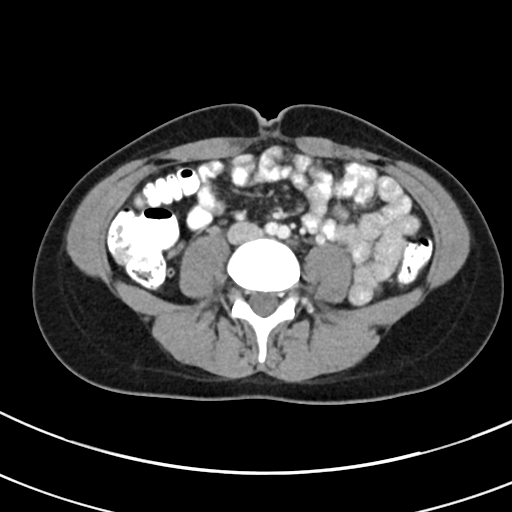
[im 54/90  soft-tissue]
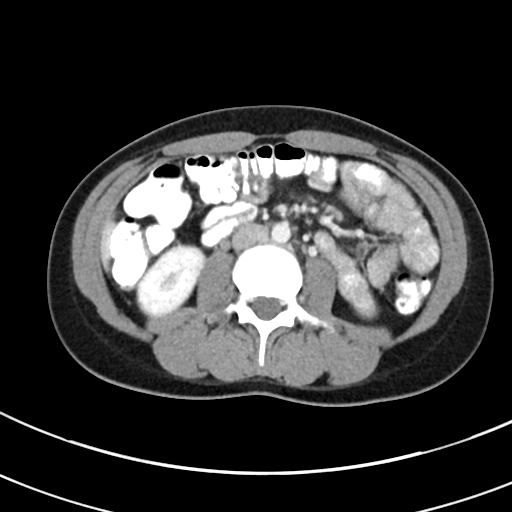
[im 54/90  bone]
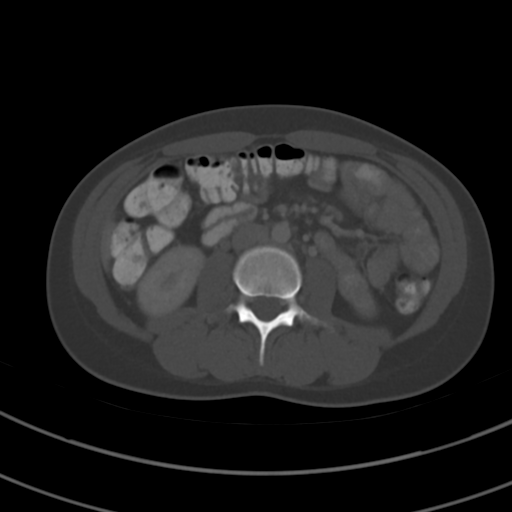
[im 61/90  soft-tissue]
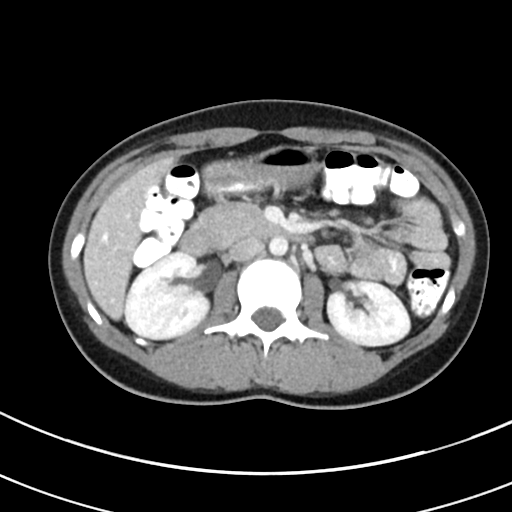
[im 68/90  soft-tissue]
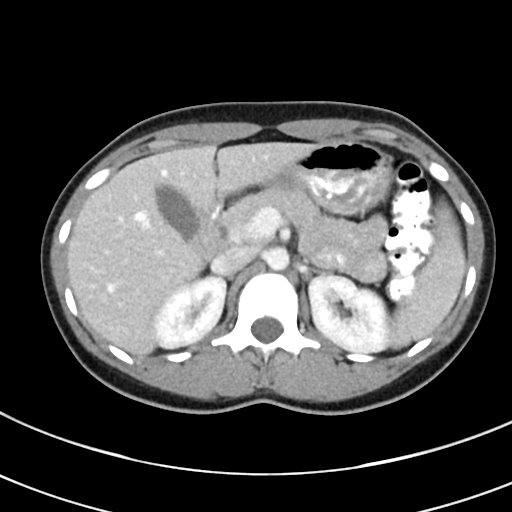
[im 72/90  soft-tissue]
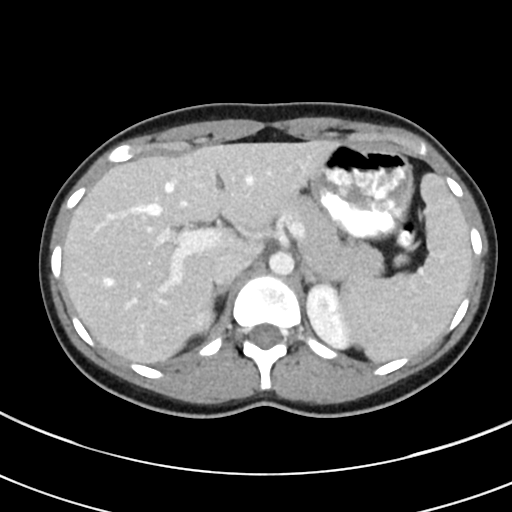
[im 79/90  soft-tissue]
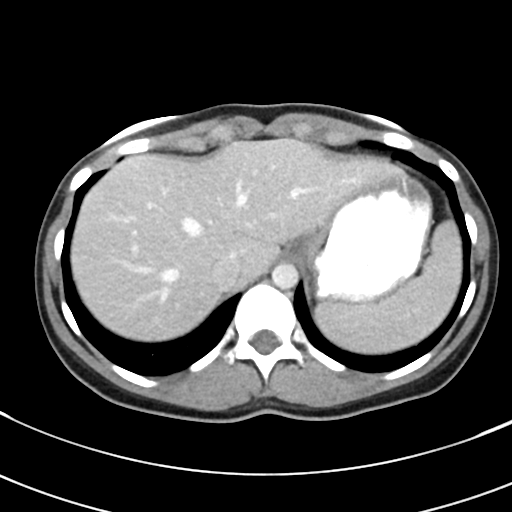
[im 86/90  soft-tissue]
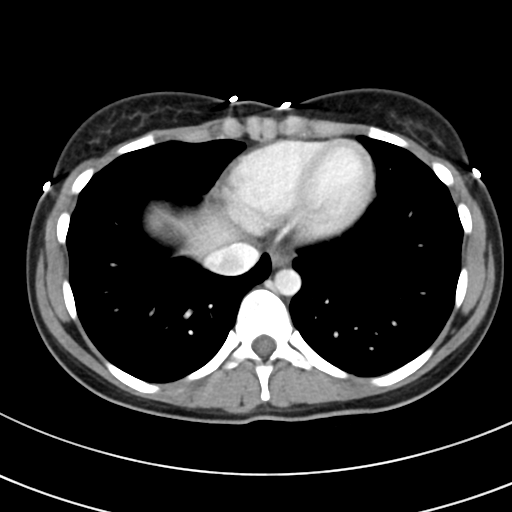

[Series 5: coronal · coronal · 0.81mm/px · 3 of 73 slices shown]
[im 25/73  soft-tissue]
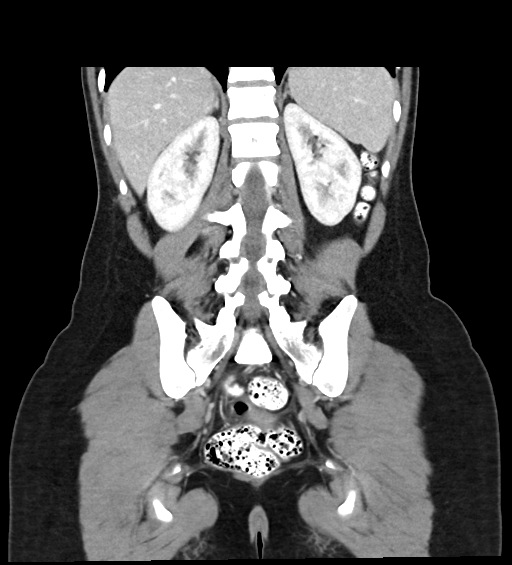
[im 33/73  soft-tissue]
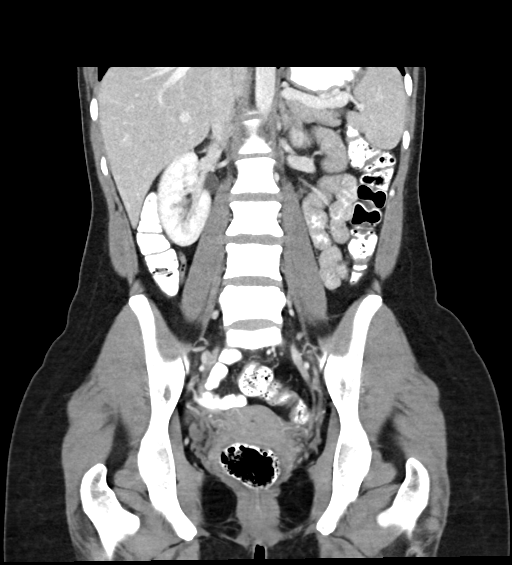
[im 41/73  soft-tissue]
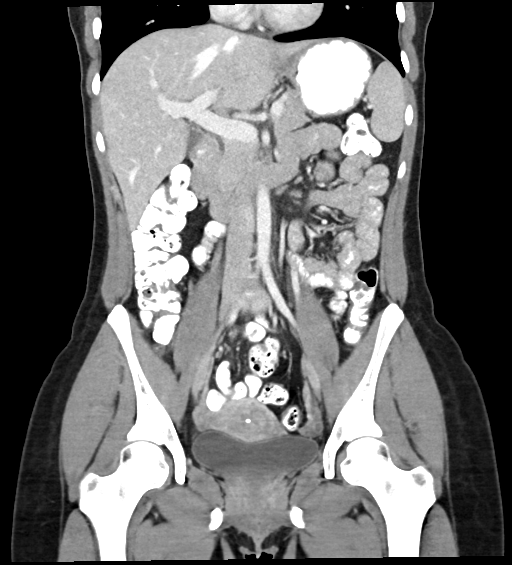

[17 of 46 positions shown; findings below may reference images not displayed]

RADIATION DOSE REDUCTION: This exam was performed according to the
departmental dose-optimization program which includes automated
exposure control, adjustment of the mA and/or kV according to
patient size and/or use of iterative reconstruction technique.

CONTRAST:  65mL OMNIPAQUE IOHEXOL 300 MG/ML  SOLN
FINDINGS: Lower chest: Lung bases are clear. No effusions. Heart is normal
size.

Hepatobiliary: No focal hepatic abnormality. Gallbladder
unremarkable.

Pancreas: No focal abnormality or ductal dilatation.

Spleen: No focal abnormality.  Normal size.

Adrenals/Urinary Tract: No adrenal abnormality. No focal renal
abnormality. No stones or hydronephrosis. Urinary bladder is
unremarkable.

Stomach/Bowel: Stomach, large and small bowel grossly unremarkable.

Vascular/Lymphatic: No evidence of aneurysm or adenopathy.

Reproductive: Uterus and adnexa unremarkable. No mass. IUD in the
uterus.

Other: No free fluid or free air.

Musculoskeletal: No acute bony abnormality.
IMPRESSION: No acute findings in the abdomen or pelvis.

No explanation for the patient's left lower quadrant pain.

## 2022-12-05 ENCOUNTER — Ambulatory Visit (HOSPITAL_BASED_OUTPATIENT_CLINIC_OR_DEPARTMENT_OTHER): Payer: Medicaid Other | Admitting: Certified Registered"

## 2022-12-05 ENCOUNTER — Ambulatory Visit (HOSPITAL_COMMUNITY)
Admission: RE | Admit: 2022-12-05 | Discharge: 2022-12-05 | Disposition: A | Discharge status: Home / Self Care | Payer: Medicaid Other | Attending: Obstetrics & Gynecology | Admitting: Obstetrics & Gynecology

## 2022-12-05 ENCOUNTER — Ambulatory Visit (HOSPITAL_COMMUNITY): Payer: Medicaid Other | Admitting: Certified Registered"

## 2022-12-05 ENCOUNTER — Encounter (HOSPITAL_COMMUNITY): Payer: Self-pay | Admitting: Obstetrics & Gynecology

## 2022-12-05 ENCOUNTER — Other Ambulatory Visit: Payer: Self-pay

## 2022-12-05 ENCOUNTER — Encounter (HOSPITAL_COMMUNITY)
Admission: RE | Disposition: A | Discharge status: Home / Self Care | Payer: Self-pay | Source: Home / Self Care | Attending: Obstetrics & Gynecology

## 2022-12-05 DIAGNOSIS — F17210 Nicotine dependence, cigarettes, uncomplicated: Secondary | ICD-10-CM

## 2022-12-05 DIAGNOSIS — R87613 High grade squamous intraepithelial lesion on cytologic smear of cervix (HGSIL): Secondary | ICD-10-CM

## 2022-12-05 DIAGNOSIS — F17290 Nicotine dependence, other tobacco product, uncomplicated: Secondary | ICD-10-CM | POA: Insufficient documentation

## 2022-12-05 DIAGNOSIS — N843 Polyp of vulva: Secondary | ICD-10-CM | POA: Insufficient documentation

## 2022-12-05 HISTORY — PX: CERVICAL ABLATION: SHX5771

## 2022-12-05 HISTORY — DX: Other specified health status: Z78.9

## 2022-12-05 SURGERY — ABLATION, CERVIX
Anesthesia: General

## 2022-12-05 MED ORDER — PROPOFOL 10 MG/ML IV BOLUS
INTRAVENOUS | Status: DC | PRN
  Administered 2022-12-05: 150 mg via INTRAVENOUS

## 2022-12-05 MED ORDER — CEFAZOLIN SODIUM-DEXTROSE 2-4 GM/100ML-% IV SOLN
INTRAVENOUS | Status: AC
  Filled 2022-12-05: qty 100

## 2022-12-05 MED ORDER — GLYCOPYRROLATE PF 0.2 MG/ML IJ SOSY
PREFILLED_SYRINGE | INTRAMUSCULAR | Status: AC
  Filled 2022-12-05: qty 1

## 2022-12-05 MED ORDER — MIDAZOLAM HCL 5 MG/5ML IJ SOLN
INTRAMUSCULAR | Status: DC | PRN
  Administered 2022-12-05: 2 mg via INTRAVENOUS

## 2022-12-05 MED ORDER — FERRIC SUBSULFATE 259 MG/GM EX SOLN
CUTANEOUS | Status: AC
  Filled 2022-12-05: qty 8

## 2022-12-05 MED ORDER — CEFAZOLIN SODIUM-DEXTROSE 2-4 GM/100ML-% IV SOLN
2.0000 g | INTRAVENOUS | Status: AC
  Administered 2022-12-05: 2 g via INTRAVENOUS

## 2022-12-05 MED ORDER — DEXMEDETOMIDINE HCL IN NACL 80 MCG/20ML IV SOLN
INTRAVENOUS | Status: DC | PRN
  Administered 2022-12-05: 4 ug via INTRAVENOUS
  Administered 2022-12-05 (×2): 8 ug via INTRAVENOUS

## 2022-12-05 MED ORDER — ORAL CARE MOUTH RINSE: 15.0000 mL | Freq: Once | OROMUCOSAL | Status: AC

## 2022-12-05 MED ORDER — POVIDONE-IODINE 10 % EX SWAB: 2.0000 | Freq: Once | CUTANEOUS | Status: DC

## 2022-12-05 MED ORDER — ONDANSETRON HCL 4 MG/2ML IJ SOLN
INTRAMUSCULAR | Status: DC | PRN
  Administered 2022-12-05: 4 mg via INTRAVENOUS

## 2022-12-05 MED ORDER — LIDOCAINE HCL (PF) 2 % IJ SOLN
INTRAMUSCULAR | Status: AC
  Filled 2022-12-05: qty 5

## 2022-12-05 MED ORDER — ACETIC ACID 5 % SOLN
Status: DC | PRN
  Administered 2022-12-05: 1 via TOPICAL

## 2022-12-05 MED ORDER — FENTANYL CITRATE (PF) 100 MCG/2ML IJ SOLN
INTRAMUSCULAR | Status: AC
  Filled 2022-12-05: qty 2

## 2022-12-05 MED ORDER — KETOROLAC TROMETHAMINE 10 MG PO TABS: 10.0000 mg | ORAL_TABLET | Freq: Three times a day (TID) | ORAL | 0 refills | Status: DC | PRN

## 2022-12-05 MED ORDER — DEXAMETHASONE SODIUM PHOSPHATE 10 MG/ML IJ SOLN
INTRAMUSCULAR | Status: AC
  Filled 2022-12-05: qty 1

## 2022-12-05 MED ORDER — ONDANSETRON HCL 4 MG/2ML IJ SOLN
INTRAMUSCULAR | Status: AC
  Filled 2022-12-05: qty 2

## 2022-12-05 MED ORDER — WATER FOR IRRIGATION, STERILE IR SOLN
Status: DC | PRN
  Administered 2022-12-05: 1000 mL via TOPICAL

## 2022-12-05 MED ORDER — LACTATED RINGERS IV SOLN: INTRAVENOUS | Status: DC

## 2022-12-05 MED ORDER — LIDOCAINE HCL (CARDIAC) PF 100 MG/5ML IV SOSY
PREFILLED_SYRINGE | INTRAVENOUS | Status: DC | PRN
  Administered 2022-12-05: 50 mg via INTRAVENOUS

## 2022-12-05 MED ORDER — ONDANSETRON 8 MG PO TBDP: 8.0000 mg | ORAL_TABLET | Freq: Three times a day (TID) | ORAL | 0 refills | Status: DC | PRN

## 2022-12-05 MED ORDER — DEXAMETHASONE SODIUM PHOSPHATE 10 MG/ML IJ SOLN
INTRAMUSCULAR | Status: DC | PRN
  Administered 2022-12-05: 10 mg via INTRAVENOUS

## 2022-12-05 MED ORDER — PROPOFOL 10 MG/ML IV BOLUS
INTRAVENOUS | Status: AC
Start: 2022-12-05 — End: ?
  Filled 2022-12-05: qty 20

## 2022-12-05 MED ORDER — KETOROLAC TROMETHAMINE 30 MG/ML IJ SOLN
30.0000 mg | Freq: Once | INTRAMUSCULAR | Status: AC
  Administered 2022-12-05: 30 mg via INTRAVENOUS
  Filled 2022-12-05: qty 1

## 2022-12-05 MED ORDER — CHLORHEXIDINE GLUCONATE 0.12 % MT SOLN
15.0000 mL | Freq: Once | OROMUCOSAL | Status: AC
  Administered 2022-12-05: 15 mL via OROMUCOSAL

## 2022-12-05 MED ORDER — MIDAZOLAM HCL 2 MG/2ML IJ SOLN
INTRAMUSCULAR | Status: AC
  Filled 2022-12-05: qty 2

## 2022-12-05 MED ORDER — TRIPLE ANTIBIOTIC 3.5-400-5000 EX OINT
TOPICAL_OINTMENT | CUTANEOUS | Status: AC
  Filled 2022-12-05: qty 1

## 2022-12-05 MED ORDER — HYDROCODONE-ACETAMINOPHEN 5-325 MG PO TABS: 1.0000 | ORAL_TABLET | Freq: Four times a day (QID) | ORAL | 0 refills | Status: DC | PRN

## 2022-12-05 MED ORDER — FENTANYL CITRATE (PF) 100 MCG/2ML IJ SOLN
INTRAMUSCULAR | Status: DC | PRN
  Administered 2022-12-05 (×2): 50 ug via INTRAVENOUS

## 2022-12-05 MED ORDER — GLYCOPYRROLATE PF 0.2 MG/ML IJ SOSY
PREFILLED_SYRINGE | INTRAMUSCULAR | Status: DC | PRN
  Administered 2022-12-05: .2 mg via INTRAVENOUS

## 2022-12-05 MED ORDER — FERRIC SUBSULFATE 259 MG/GM EX SOLN
CUTANEOUS | Status: DC | PRN
  Administered 2022-12-05: 1

## 2022-12-05 SURGICAL SUPPLY — 28 items
APL FBRTP 16 NS LF PRCTSCP (MISCELLANEOUS) ×1
BAG HAMPER (MISCELLANEOUS) ×1 IMPLANT
CLOTH BEACON ORANGE TIMEOUT ST (SAFETY) ×1 IMPLANT
COVER LIGHT HANDLE STERIS (MISCELLANEOUS) ×2 IMPLANT
COVER MAYO STAND XLG (MISCELLANEOUS) ×1 IMPLANT
GAUZE 4X4 16PLY ~~LOC~~+RFID DBL (SPONGE) ×1 IMPLANT
GLOVE BIOGEL M 7.0 STRL (GLOVE) IMPLANT
GLOVE BIOGEL PI IND STRL 7.0 (GLOVE) ×2 IMPLANT
GLOVE BIOGEL PI IND STRL 8 (GLOVE) ×1 IMPLANT
GLOVE ECLIPSE 8.0 STRL XLNG CF (GLOVE) ×2 IMPLANT
GOWN STRL REUS W/TWL LRG LVL3 (GOWN DISPOSABLE) ×1 IMPLANT
GOWN STRL REUS W/TWL XL LVL3 (GOWN DISPOSABLE) ×1 IMPLANT
KIT TURNOVER KIT A (KITS) ×1 IMPLANT
LASER FIBER 1000M SMARTSCOPE (Laser) ×1 IMPLANT
MANIFOLD NEPTUNE II (INSTRUMENTS) ×1 IMPLANT
MARKER SKIN DUAL TIP RULER LAB (MISCELLANEOUS) ×1 IMPLANT
PACK BASIC III (CUSTOM PROCEDURE TRAY) ×1
PACK SRG BSC III STRL LF ECLPS (CUSTOM PROCEDURE TRAY) ×1 IMPLANT
PAD ARMBOARD 7.5X6 YLW CONV (MISCELLANEOUS) ×1 IMPLANT
PAD TELFA 3X4 1S STER (GAUZE/BANDAGES/DRESSINGS) IMPLANT
SCOPETTES 8  STERILE (MISCELLANEOUS) ×1
SCOPETTES 8 STERILE (MISCELLANEOUS) ×1 IMPLANT
SET BASIN LINEN APH (SET/KITS/TRAYS/PACK) ×1 IMPLANT
SHEET LAVH (DRAPES) ×1 IMPLANT
SWAB PROCTOSCOPIC (MISCELLANEOUS) ×1 IMPLANT
TOWEL OR 17X26 4PK STRL BLUE (TOWEL DISPOSABLE) ×1 IMPLANT
TUBING SMOKE EVAC CO2 (TUBING) ×1 IMPLANT
WATER STERILE IRR 1000ML POUR (IV SOLUTION) ×1 IMPLANT

## 2022-12-05 NOTE — Transfer of Care (Signed)
Immediate Anesthesia Transfer of Care Note  Patient: Joyce Wilson  Procedure(s) Performed: CERVICAL ABLATION  Patient Location: PACU  Anesthesia Type:General  Level of Consciousness: awake, alert , oriented, and patient cooperative  Airway & Oxygen Therapy: Patient Spontanous Breathing  Post-op Assessment: Report given to RN, Post -op Vital signs reviewed and stable, and Patient moving all extremities X 4  Post vital signs: Reviewed and stable  Last Vitals:  Vitals Value Taken Time  BP 86/55 12/05/22 1004  Temp 36.7 C 12/05/22 1004  Pulse 78 12/05/22 1006  Resp 14 12/05/22 1006  SpO2 100 % 12/05/22 1006  Vitals shown include unvalidated device data.  Last Pain:  Vitals:   12/05/22 0831  TempSrc: Oral  PainSc: 0-No pain         Complications: No notable events documented.

## 2022-12-05 NOTE — Anesthesia Procedure Notes (Addendum)
Procedure Name: LMA Insertion Date/Time: 12/05/2022 9:35 AM  Performed by: Jeanette Caprice, CRNAPatient Re-evaluated:Patient Re-evaluated prior to induction Oxygen Delivery Method: Circle system utilized Preoxygenation: Pre-oxygenation with 100% oxygen Induction Type: IV induction LMA: LMA with gastric port inserted LMA Size: 4.0 Number of attempts: 1 Placement Confirmation: breath sounds checked- equal and bilateral and positive ETCO2 Dental Injury: Teeth and Oropharynx as per pre-operative assessment  Comments: Atraumatic insertion of LMA.  Dentition/mucosa in pre-procedure condition

## 2022-12-05 NOTE — Op Note (Signed)
Preoperative Diagnosis:  HPV 16 associated High Grade Squamous Intraepithelial lesion, adequate colposcopy, large expansive lesion, wide squalmocolumnar extension  Postoperative Diagnosis:  Same as above  Procedure:  Laser ablation of the cervix  Surgeon:  Lazaro Arms MD  Anaesthesia:  Laryngeal Mask Airway  Findings:  Patient had an abnormal pap smear which was evaluated in the office with colposcopy and directed biopsies.  Pathology report returned as high Grade SIL.  The colposcopy was adequate.  As a result, the patient is admitted for laser ablation of the cervix.  Description of Note:  Patient was taken to the OR and placed in the supine position where she underwent laryngeal mask airway anaesthesia.  She was placed in the dorsal lithotomy position.  She was draped for laser.  Graves speculum was placed and 3% acetic acid used and the laser microscope employed to perform colposcopy which confirmed the office findings.  Laser was used on typical cervical settings and used to vaporized the squamocolumnar junction to  depth of  5-7 mm peripherally and 7-9 mm centrally.  Surgical margin of several mm was employed beyond the acetowhite epithelium.  Hemostasis was achieved with the laser and Monsel's solution.  Patient was awakened from anaesthesia in good stable condition and all counts were correct.  She received Ancef 2 gram and Toradol 30 mg IV preoperatively prophylactically.   Small left vulvar mole was removed at patient request Removed with scalpel Closed with 2 3-0 ethilon sutures Neosporin placed  Lazaro Arms, MD 12/05/2022 10:02 AM

## 2022-12-05 NOTE — Anesthesia Preprocedure Evaluation (Signed)
Anesthesia Evaluation  Patient identified by MRN, date of birth, ID band Patient awake    Reviewed: Allergy & Precautions, H&P , NPO status , Patient's Chart, lab work & pertinent test results, reviewed documented beta blocker date and time   Airway Mallampati: II  TM Distance: >3 FB Neck ROM: full    Dental no notable dental hx.    Pulmonary neg pulmonary ROS, Current Smoker and Patient abstained from smoking.   Pulmonary exam normal breath sounds clear to auscultation       Cardiovascular Exercise Tolerance: Good negative cardio ROS  Rhythm:regular Rate:Normal     Neuro/Psych  PSYCHIATRIC DISORDERS  Depression    negative neurological ROS  negative psych ROS   GI/Hepatic negative GI ROS, Neg liver ROS,,,  Endo/Other  negative endocrine ROS    Renal/GU negative Renal ROS  negative genitourinary   Musculoskeletal   Abdominal   Peds  Hematology negative hematology ROS (+)   Anesthesia Other Findings   Reproductive/Obstetrics negative OB ROS                             Anesthesia Physical Anesthesia Plan  ASA: 2  Anesthesia Plan: General and General LMA   Post-op Pain Management:    Induction:   PONV Risk Score and Plan: Ondansetron  Airway Management Planned:   Additional Equipment:   Intra-op Plan:   Post-operative Plan:   Informed Consent: I have reviewed the patients History and Physical, chart, labs and discussed the procedure including the risks, benefits and alternatives for the proposed anesthesia with the patient or authorized representative who has indicated his/her understanding and acceptance.     Dental Advisory Given  Plan Discussed with: CRNA  Anesthesia Plan Comments:        Anesthesia Quick Evaluation

## 2022-12-05 NOTE — H&P (Signed)
Preoperative History and Physical  Joyce Wilson is a 27 y.o. [phone] with No LMP recorded (lmp unknown). 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    PMH:    Past Medical History:  Diagnosis Date   Burning with urination 02/01/2015   Chicken pox    Chlamydia infection    Dyspareunia    Hematuria 02/01/2015   Medical history non-contributory    Screening for STD (sexually transmitted disease) 02/01/2015   Vaginal irritation 02/01/2015   Vaginal Pap smear, abnormal     PSH:     Past Surgical History:  Procedure Laterality Date   NO PAST SURGERIES      POb/GynH:      OB History     Gravida  2   Para  2   Term  2   Preterm  0   AB  0   Living  2      SAB  0   IAB  0   Ectopic  0   Multiple  0   Live Births  2           SH:   Social History   Tobacco Use   Smoking status: Every Day    Types: E-cigarettes   Smokeless tobacco: Never   Tobacco comments:    vapes  Vaping Use   Vaping Use: Every day  Substance Use Topics   Alcohol use: Yes    Comment: occ   Drug use: No    FH:    Family History  Problem Relation Age of Onset   Healthy Sister    Diabetes Maternal Grandfather    Hyperlipidemia Maternal Grandfather    Heart disease Maternal Grandfather    Hypertension Maternal Grandfather    Healthy Mother    Healthy Father    Heart disease Maternal Uncle      Allergies:  Allergies  Allergen Reactions   Penicillins Rash    Has patient had a PCN reaction causing immediate rash, facial/tongue/throat swelling, SOB or lightheadedness with hypotension: unknown Has patient had a PCN reaction causing severe rash involving mucus membranes or skin necrosis: unknown Has patient had a PCN reaction that required hospitalization: no Has patient had a PCN reaction occurring within the last 10 years: no If all of the above answers are "NO", then may proceed with Cephalosporin  use. Pt does not know what reaction she had     Medications:       Current Facility-Administered Medications:    ceFAZolin (ANCEF) 2-4 GM/100ML-% IVPB, , , ,    ceFAZolin (ANCEF) IVPB 2g/100 mL premix, 2 g, Intravenous, On Call to OR, Lazaro Arms, MD   povidone-iodine 10 % swab 2 Application, 2 Application, Topical, Once, Lazaro Arms, MD  Review of Systems:   Review of Systems  Constitutional: Negative for fever, chills, weight loss, malaise/fatigue and diaphoresis.  HENT: Negative for hearing loss, ear pain, nosebleeds, congestion, sore throat, neck pain, tinnitus and ear discharge.   Eyes: Negative for blurred vision, double vision, photophobia, pain, discharge and redness.  Respiratory: Negative for cough, hemoptysis, sputum production, shortness of breath, wheezing and stridor.   Cardiovascular: Negative for chest pain, palpitations, orthopnea, claudication, leg swelling and PND.  Gastrointestinal: Positive for abdominal pain. Negative for heartburn, nausea, vomiting, diarrhea, constipation, blood in stool and melena.  Genitourinary: Negative for dysuria, urgency, frequency, hematuria and flank pain.  Musculoskeletal: Negative for myalgias, back  pain, joint pain and falls.  Skin: Negative for itching and rash.  Neurological: Negative for dizziness, tingling, tremors, sensory change, speech change, focal weakness, seizures, loss of consciousness, weakness and headaches.  Endo/Heme/Allergies: Negative for environmental allergies and polydipsia. Does not bruise/bleed easily.  Psychiatric/Behavioral: Negative for depression, suicidal ideas, hallucinations, memory loss and substance abuse. The patient is not nervous/anxious and does not have insomnia.      PHYSICAL EXAM:  Blood pressure 117/64, pulse 94, temperature 98.4 F (36.9 C), temperature source Oral, resp. rate 20, SpO2 100 %.    Vitals reviewed. Constitutional: She is oriented to person, place, and time. She appears  well-developed and well-nourished.  HENT:  Head: Normocephalic and atraumatic.  Right Ear: External ear normal.  Left Ear: External ear normal.  Nose: Nose normal.  Mouth/Throat: Oropharynx is clear and moist.  Eyes: Conjunctivae and EOM are normal. Pupils are equal, round, and reactive to light. Right eye exhibits no discharge. Left eye exhibits no discharge. No scleral icterus.  Neck: Normal range of motion. Neck supple. No tracheal deviation present. No thyromegaly present.  Cardiovascular: Normal rate, regular rhythm, normal heart sounds and intact distal pulses.  Exam reveals no gallop and no friction rub.   No murmur heard. Respiratory: Effort normal and breath sounds normal. No respiratory distress. She has no wheezes. She has no rales. She exhibits no tenderness.  GI: Soft. Bowel sounds are normal. She exhibits no distension and no mass. There is tenderness. There is no rebound and no guarding.  Genitourinary:       Vulva is normal without lesions Vagina is pink moist without discharge Cervix per colpo Uterus is normal size, contour, position, consistency, mobility, non-tender Adnexa is negative with normal sized ovaries by sonogram  Musculoskeletal: Normal range of motion. She exhibits no edema and no tenderness.  Neurological: She is alert and oriented to person, place, and time. She has normal reflexes. She displays normal reflexes. No cranial nerve deficit. She exhibits normal muscle tone. Coordination normal.  Skin: Skin is warm and dry. No rash noted. No erythema. No pallor.  Psychiatric: She has a normal mood and affect. Her behavior is normal. Judgment and thought content normal.    Labs: Results for orders placed or performed during the hospital encounter of 12/03/22 (from the past 336 hour(s))  Type and screen   Collection Time: 12/03/22  2:17 PM  Result Value Ref Range   ABO/RH(D) A POS    Antibody Screen NEG    Sample Expiration 12/17/2022,2359    Extend sample  reason      NO TRANSFUSIONS OR PREGNANCY IN THE PAST 3 MONTHS Performed at [facility], [address], [place], [place] [address]   CBC   Collection Time: 12/03/22  2:19 PM  Result Value Ref Range   WBC 8.9 4.0 - 10.5 K/uL   RBC 4.59 3.87 - 5.11 MIL/uL   Hemoglobin 14.1 12.0 - 15.0 g/dL   HCT 81.1 91.4 - 78.2 %   MCV 90.6 80.0 - 100.0 fL   MCH 30.7 26.0 - 34.0 pg   MCHC 33.9 30.0 - 36.0 g/dL   RDW 95.6 21.3 - 08.6 %   Platelets 322 150 - 400 K/uL   nRBC 0.0 0.0 - 0.2 %  Comprehensive metabolic panel   Collection Time: 12/03/22  2:19 PM  Result Value Ref Range   Sodium 134 (L) 135 - 145 mmol/L   Potassium 3.3 (L) 3.5 - 5.1 mmol/L   Chloride 102 98 -  111 mmol/L   CO2 24 22 - 32 mmol/L   Glucose, Bld 93 70 - 99 mg/dL   BUN 11 6 - 20 mg/dL   Creatinine, Ser 1.30 0.44 - 1.00 mg/dL   Calcium 9.1 8.9 - 86.5 mg/dL   Total Protein 7.1 6.5 - 8.1 g/dL   Albumin 4.1 3.5 - 5.0 g/dL   AST 16 15 - 41 U/L   ALT 19 0 - 44 U/L   Alkaline Phosphatase 41 38 - 126 U/L   Total Bilirubin 1.3 (H) 0.3 - 1.2 mg/dL   GFR, Estimated >78 >46 mL/min   Anion gap 8 5 - 15  Rapid HIV screen (HIV 1/2 Ab+Ag)   Collection Time: 12/03/22  2:19 PM  Result Value Ref Range   HIV-1 P24 Antigen - HIV24 NON REACTIVE NON REACTIVE   HIV 1/2 Antibodies NON REACTIVE NON REACTIVE   Interpretation (HIV Ag Ab)      A non reactive test result means that HIV 1 or HIV 2 antibodies and HIV 1 p24 antigen were not detected in the specimen.  Urinalysis, Routine w reflex microscopic -Urine, Clean Catch   Collection Time: 12/03/22  2:19 PM  Result Value Ref Range   Color, Urine YELLOW YELLOW   APPearance HAZY (A) CLEAR   Specific Gravity, Urine 1.023 1.005 - 1.030   pH 7.0 5.0 - 8.0   Glucose, UA NEGATIVE NEGATIVE mg/dL   Hgb urine dipstick NEGATIVE NEGATIVE   Bilirubin Urine NEGATIVE NEGATIVE   Ketones, ur NEGATIVE NEGATIVE mg/dL   Protein, ur NEGATIVE NEGATIVE mg/dL   Nitrite NEGATIVE NEGATIVE   Leukocytes,Ua TRACE  (A) NEGATIVE   RBC / HPF 0-5 0 - 5 RBC/hpf   WBC, UA 0-5 0 - 5 WBC/hpf   Bacteria, UA NONE SEEN NONE SEEN   Squamous Epithelial / HPF 6-10 0 - 5 /HPF   Mucus PRESENT   Pregnancy, urine POC   Collection Time: 12/03/22  2:26 PM  Result Value Ref Range   Preg Test, Ur NEGATIVE NEGATIVE    EKG: No orders found for this or any previous visit.  Imaging Studies: No results found.    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

## 2022-12-06 LAB — SURGICAL PATHOLOGY

## 2022-12-07 NOTE — Anesthesia Postprocedure Evaluation (Signed)
Anesthesia Post Note  Patient: Designer, jewellery  Procedure(s) Performed: CERVICAL ABLATION  Patient location during evaluation: Phase II Anesthesia Type: General Level of consciousness: awake Pain management: pain level controlled Vital Signs Assessment: post-procedure vital signs reviewed and stable Respiratory status: spontaneous breathing and respiratory function stable Cardiovascular status: blood pressure returned to baseline and stable Postop Assessment: no headache and no apparent nausea or vomiting Anesthetic complications: no Comments: Late entry   No notable events documented.   Last Vitals:  Vitals:   12/05/22 1030 12/05/22 1036  BP: 99/64 100/69  Pulse: 83 91  Resp: 12 18  Temp:  (!) 36.4 C  SpO2: 100% 100%    Last Pain:  Vitals:   12/06/22 1346  TempSrc:   PainSc: 0-No pain                 Windell Norfolk

## 2022-12-11 ENCOUNTER — Encounter: Payer: Self-pay | Admitting: Obstetrics & Gynecology

## 2022-12-11 ENCOUNTER — Encounter (HOSPITAL_COMMUNITY): Payer: Self-pay | Admitting: Obstetrics & Gynecology

## 2022-12-17 ENCOUNTER — Ambulatory Visit (INDEPENDENT_AMBULATORY_CARE_PROVIDER_SITE_OTHER): Payer: Medicaid Other | Admitting: Obstetrics & Gynecology

## 2022-12-17 VITALS — BP 115/80 | HR 85 | Ht 63.0 in | Wt 125.0 lb

## 2022-12-17 DIAGNOSIS — L501 Idiopathic urticaria: Secondary | ICD-10-CM

## 2022-12-17 MED ORDER — LEVOCETIRIZINE DIHYDROCHLORIDE 5 MG PO TABS: 5.0000 mg | ORAL_TABLET | Freq: Every evening | ORAL | 11 refills | Status: DC

## 2022-12-17 NOTE — Progress Notes (Signed)
  HPI: Patient returns for routine postoperative follow-up having undergone laser ablation of the cervix  on 12/05/22.  The patient's immediate postoperative recovery has been unremarkable. Since hospital discharge the patient reports malodorous discharge, black discharge.   Current Outpatient Medications: clobetasol cream (TEMOVATE) 0.05 %, Apply 1 Application topically 2 (two) times daily. (Patient not taking: Reported on 12/17/2022), Disp: , Rfl:  HYDROcodone-acetaminophen (NORCO/VICODIN) 5-325 MG tablet, Take 1 tablet by mouth every 6 (six) hours as needed. (Patient not taking: Reported on 12/17/2022), Disp: 10 tablet, Rfl: 0 ketorolac (TORADOL) 10 MG tablet, Take 1 tablet (10 mg total) by mouth every 8 (eight) hours as needed. (Patient not taking: Reported on 12/17/2022), Disp: 15 tablet, Rfl: 0 levonorgestrel (LILETTA, 52 MG,) 19.5 MCG/DAY IUD IUD, 1 each by Intrauterine route once. (Patient not taking: Reported on 12/17/2022), Disp: , Rfl:  ondansetron (ZOFRAN-ODT) 8 MG disintegrating tablet, Take 1 tablet (8 mg total) by mouth every 8 (eight) hours as needed for nausea or vomiting. (Patient not taking: Reported on 12/17/2022), Disp: 8 tablet, Rfl: 0 triamcinolone cream (KENALOG) 0.1 %, Apply 1 Application topically 2 (two) times daily. (Patient not taking: Reported on 12/17/2022), Disp: , Rfl:   No current facility-administered medications for this visit.   Meds ordered this encounter  Medications   levocetirizine (XYZAL ALLERGY 24HR) 5 MG tablet    Sig: Take 1 tablet (5 mg total) by mouth every evening.    Dispense:  30 tablet    Refill:  11    Blood pressure 115/80, pulse 85, height 5\' 3"  (1.6 m), weight 125 lb (56.7 kg).  Physical Exam: Cervical laser bed is still healing but is healthy healing well 2 sutures removed  Diagnostic Tests:   Pathology: HSIL  Impression + Management plan: No diagnosis found.    Medications Prescribed this encounter: No orders of the defined  types were placed in this encounter.     Follow up: No follow-ups on file.    Lazaro Arms, MD Attending Physician for the [facility] and [facility] 12/17/2022 3:51 PM

## 2022-12-27 MED ORDER — LEVOCETIRIZINE DIHYDROCHLORIDE 5 MG PO TABS: 10.0000 mg | ORAL_TABLET | Freq: Every evening | ORAL | 3 refills | Status: DC

## 2023-03-25 ENCOUNTER — Ambulatory Visit: Payer: Medicaid Other | Admitting: Obstetrics & Gynecology

## 2023-03-25 ENCOUNTER — Encounter: Payer: Self-pay | Admitting: Obstetrics & Gynecology

## 2023-03-25 VITALS — BP 116/73 | HR 83 | Ht 63.0 in | Wt 127.0 lb

## 2023-03-25 DIAGNOSIS — B3731 Acute candidiasis of vulva and vagina: Secondary | ICD-10-CM

## 2023-03-25 MED ORDER — TERCONAZOLE 0.4 % VA CREA: 1.0000 | TOPICAL_CREAM | Freq: Every day | VAGINAL | 0 refills | Status: DC

## 2023-03-25 MED ORDER — SILVER SULFADIAZINE 1 % EX CREA
TOPICAL_CREAM | CUTANEOUS | 11 refills | Status: DC
Start: 2023-03-25 — End: 2023-12-18

## 2023-03-25 NOTE — Progress Notes (Signed)
       Chief Complaint  Patient presents with   Vaginal Discharge    Has been having issues since procedure in May    Blood pressure 116/73, pulse 83, height 5\' 3"  (1.6 m), weight 127 lb (57.6 kg).  27 y.o. [id number] No LMP recorded. The current method of family planning is IUD.  Subjective Vaginal discharge for 2months Itching no Irritation no Odor no Similar to previous no  Previous treatment na  Objective Vulva:  ?MC vs folliculitis Vagina:  +yeast Cervix:  well healed     Pertinent ROS   Labs or studies Wet Prep:   A sample of vaginal discharge was obtained from the posterior fornix using a cotton swab. 2 drops of saline were placed on a slide and the cotton swab was immersed in the saline. Microscopic evaluation was performed and results were as follows:  Positive  for yeast  Negative for clue cells , consistent with Bacterial vaginosis Negative for trichomonas  Normal WBC population   Whiff test: Negative     Impression Diagnoses this Encounter::   ICD-10-CM   1. Yeast vaginitis  B37.31       Established relevant diagnosis(es):   Plan/Recommendations: Meds ordered this encounter  Medications   terconazole (TERAZOL 7) 0.4 % vaginal cream    Sig: Place 1 applicator vaginally at bedtime.    Dispense:  45 g    Refill:  0   silver sulfADIAZINE (SILVADENE) 1 % cream    Sig: Apply to area 3 times daily prn    Dispense:  50 g    Refill:  11    Labs or Scans Ordered: No orders of the defined types were placed in this encounter.   Management:: Above  Silvadene for vulvar MC  Follow up Return if symptoms worsen or fail to improve.      All questions were answered.

## 2023-05-13 ENCOUNTER — Other Ambulatory Visit (INDEPENDENT_AMBULATORY_CARE_PROVIDER_SITE_OTHER): Payer: Medicaid Other

## 2023-05-13 ENCOUNTER — Other Ambulatory Visit (HOSPITAL_COMMUNITY)
Admission: RE | Admit: 2023-05-13 | Discharge: 2023-05-13 | Disposition: A | Discharge status: Home / Self Care | Payer: Medicaid Other | Source: Ambulatory Visit | Attending: Obstetrics & Gynecology | Admitting: Obstetrics & Gynecology

## 2023-05-13 DIAGNOSIS — N898 Other specified noninflammatory disorders of vagina: Secondary | ICD-10-CM

## 2023-05-13 DIAGNOSIS — Z113 Encounter for screening for infections with a predominantly sexual mode of transmission: Secondary | ICD-10-CM | POA: Insufficient documentation

## 2023-05-13 NOTE — Progress Notes (Signed)
   NURSE VISIT- VAGINITIS/STD/POC  SUBJECTIVE:  Joyce Wilson is a 27 y.o. [id number] GYN patientfemale here for a vaginal swab for vaginitis and STD screening.  She reports the following symptoms: discharge described as white and thick clumpy and odor for 5 months since she had an ablation. Denies abnormal vaginal bleeding, significant pelvic pain, fever, or UTI symptoms.  OBJECTIVE:  There were no vitals taken for this visit.  Appears well, in no apparent distress  ASSESSMENT: Vaginal swab for STD and vaginitis screening  PLAN: Self-collected vaginal probe for Gonorrhea, Chlamydia, Trichomonas, Bacterial Vaginosis, Yeast sent to lab Treatment: to be determined once results are received Follow-up as needed if symptoms persist/worsen, or new symptoms develop  Caralyn Guile  05/13/2023 3:35 PM

## 2023-05-15 LAB — CERVICOVAGINAL ANCILLARY ONLY
Bacterial Vaginitis (gardnerella): NEGATIVE
Candida Glabrata: NEGATIVE
Candida Vaginitis: NEGATIVE
Chlamydia: NEGATIVE
Comment: NEGATIVE
Comment: NEGATIVE
Comment: NEGATIVE
Comment: NEGATIVE
Comment: NEGATIVE
Comment: NORMAL
Neisseria Gonorrhea: NEGATIVE
Trichomonas: NEGATIVE

## 2023-06-19 ENCOUNTER — Ambulatory Visit: Payer: Medicaid Other | Admitting: Obstetrics & Gynecology

## 2023-06-19 ENCOUNTER — Other Ambulatory Visit (HOSPITAL_COMMUNITY)
Admission: RE | Admit: 2023-06-19 | Discharge: 2023-06-19 | Disposition: A | Discharge status: Home / Self Care | Payer: Medicaid Other | Source: Ambulatory Visit | Attending: Obstetrics & Gynecology | Admitting: Obstetrics & Gynecology

## 2023-06-19 ENCOUNTER — Encounter: Payer: Self-pay | Admitting: Obstetrics & Gynecology

## 2023-06-19 VITALS — BP 135/87 | HR 118 | Ht 63.0 in | Wt 126.0 lb

## 2023-06-19 DIAGNOSIS — Z9889 Other specified postprocedural states: Secondary | ICD-10-CM | POA: Insufficient documentation

## 2023-06-19 DIAGNOSIS — B081 Molluscum contagiosum: Secondary | ICD-10-CM | POA: Diagnosis not present

## 2023-06-19 DIAGNOSIS — Z124 Encounter for screening for malignant neoplasm of cervix: Secondary | ICD-10-CM | POA: Insufficient documentation

## 2023-06-19 NOTE — Progress Notes (Unsigned)
Chief Complaint  Patient presents with   Vaginal Discharge    Vaginal discharge white and clumpy, no odor or irritation. External bumps on vulva/groin area that are itchy.      27 y.o. [id number] No LMP recorded (lmp unknown). (Menstrual status: IUD). The current method of family planning is {contraception:315051}.  Outpatient Encounter Medications as of 06/19/2023  Medication Sig Note   doxycycline (MONODOX) 50 MG capsule Take 50 mg by mouth 2 (two) times daily.    levonorgestrel (LILETTA, 52 MG,) 19.5 MCG/DAY IUD IUD 1 each by Intrauterine route once.    silver sulfADIAZINE (SILVADENE) 1 % cream Apply to area 3 times daily prn    clobetasol cream (TEMOVATE) 0.05 % Apply 1 Application topically 2 (two) times daily. (Patient not taking: Reported on 12/17/2022)    HYDROcodone-acetaminophen (NORCO/VICODIN) 5-325 MG tablet Take 1 tablet by mouth every 6 (six) hours as needed. (Patient not taking: Reported on 12/17/2022)    ketorolac (TORADOL) 10 MG tablet Take 1 tablet (10 mg total) by mouth every 8 (eight) hours as needed. (Patient not taking: Reported on 12/17/2022)    levocetirizine (XYZAL ALLERGY 24HR) 5 MG tablet Take 1 tablet (5 mg total) by mouth every evening. (Patient not taking: Reported on 03/25/2023)    levocetirizine (XYZAL ALLERGY 24HR) 5 MG tablet Take 2 tablets (10 mg total) by mouth every evening. (Patient not taking: Reported on 03/25/2023)    ondansetron (ZOFRAN-ODT) 8 MG disintegrating tablet Take 1 tablet (8 mg total) by mouth every 8 (eight) hours as needed for nausea or vomiting. (Patient not taking: Reported on 12/17/2022)    terconazole (TERAZOL 7) 0.4 % vaginal cream Place 1 applicator vaginally at bedtime. (Patient not taking: Reported on 05/13/2023)    triamcinolone cream (KENALOG) 0.1 % Apply 1 Application topically 2 (two) times daily. (Patient not taking: Reported on 12/17/2022) 11/30/2022: Has not picked up yet   Upadacitinib ER (RINVOQ) 15 MG TB24 Take 1 tablet  every day by oral route. (Patient not taking: Reported on 06/19/2023)    No facility-administered encounter medications on file as of 06/19/2023.    Subjective *** Past Medical History:  Diagnosis Date   Burning with urination 02/01/2015   Chicken pox    Chlamydia infection    Dyspareunia    Hematuria 02/01/2015   Medical history non-contributory    Screening for STD (sexually transmitted disease) 02/01/2015   Vaginal irritation 02/01/2015   Vaginal Pap smear, abnormal     Past Surgical History:  Procedure Laterality Date   CERVICAL ABLATION N/A 12/05/2022   Procedure: CERVICAL ABLATION;  Surgeon: Lazaro Arms, MD;  Location: [facility];  Service: Gynecology;  Laterality: N/A;   NO PAST SURGERIES      OB History     Gravida  2   Para  2   Term  2   Preterm  0   AB  0   Living  2      SAB  0   IAB  0   Ectopic  0   Multiple  0   Live Births  2           Allergies  Allergen Reactions   Penicillins Rash    Has patient had a PCN reaction causing immediate rash, facial/tongue/throat swelling, SOB or lightheadedness with hypotension: unknown Has patient had a PCN reaction causing severe rash involving mucus membranes or skin necrosis: unknown Has patient had a PCN reaction that required hospitalization: no Has  patient had a PCN reaction occurring within the last 10 years: no If all of the above answers are "NO", then may proceed with Cephalosporin use. Pt does not know what reaction she had     Social History   Socioeconomic History   Marital status: Single    Spouse name: Not on file   Number of children: 2   Years of education: Not on file   Highest education level: High school graduate  Occupational History   Not on file  Tobacco Use   Smoking status: Every Day    Types: E-cigarettes   Smokeless tobacco: Never   Tobacco comments:    vapes  Vaping Use   Vaping status: Every Day  Substance and Sexual Activity   Alcohol use: Yes     Comment: occ   Drug use: No   Sexual activity: Yes    Birth control/protection: I.U.D.  Other Topics Concern   Not on file  Social History Narrative   Not on file   Social Determinants of Health   Financial Resource Strain: Low Risk  (10/29/2022)   Overall Financial Resource Strain (CARDIA)    Difficulty of Paying Living Expenses: Not hard at all  Food Insecurity: No Food Insecurity (10/29/2022)   Hunger Vital Sign    Worried About Running Out of Food in the Last Year: Never true    Ran Out of Food in the Last Year: Never true  Transportation Needs: No Transportation Needs (10/29/2022)   PRAPARE - Administrator, Civil Service (Medical): No    Lack of Transportation (Non-Medical): No  Physical Activity: Insufficiently Active (10/29/2022)   Exercise Vital Sign    Days of Exercise per Week: 7 days    Minutes of Exercise per Session: 20 min  Stress: Stress Concern Present (10/29/2022)   [employer] of Occupational Health - Occupational Stress Questionnaire    Feeling of Stress : To some extent  Social Connections: Moderately Isolated (10/29/2022)   Social Connection and Isolation Panel [NHANES]    Frequency of Communication with Friends and Family: More than three times a week    Frequency of Social Gatherings with Friends and Family: Three times a week    Attends Religious Services: More than 4 times per year    Active Member of Clubs or Organizations: No    Attends Banker Meetings: Never    Marital Status: Never married    Family History  Problem Relation Age of Onset   Healthy Sister    Diabetes Maternal Grandfather    Hyperlipidemia Maternal Grandfather    Heart disease Maternal Grandfather    Hypertension Maternal Grandfather    Healthy Mother    Healthy Father    Heart disease Maternal Uncle     Medications:       Current Outpatient Medications:    doxycycline (MONODOX) 50 MG capsule, Take 50 mg by mouth 2 (two) times daily., Disp: , Rfl:     levonorgestrel (LILETTA, 52 MG,) 19.5 MCG/DAY IUD IUD, 1 each by Intrauterine route once., Disp: , Rfl:    silver sulfADIAZINE (SILVADENE) 1 % cream, Apply to area 3 times daily prn, Disp: 50 g, Rfl: 11   clobetasol cream (TEMOVATE) 0.05 %, Apply 1 Application topically 2 (two) times daily. (Patient not taking: Reported on 12/17/2022), Disp: , Rfl:    HYDROcodone-acetaminophen (NORCO/VICODIN) 5-325 MG tablet, Take 1 tablet by mouth every 6 (six) hours as needed. (Patient not taking: Reported on 12/17/2022), Disp: 10  tablet, Rfl: 0   ketorolac (TORADOL) 10 MG tablet, Take 1 tablet (10 mg total) by mouth every 8 (eight) hours as needed. (Patient not taking: Reported on 12/17/2022), Disp: 15 tablet, Rfl: 0   levocetirizine (XYZAL ALLERGY 24HR) 5 MG tablet, Take 1 tablet (5 mg total) by mouth every evening. (Patient not taking: Reported on 03/25/2023), Disp: 30 tablet, Rfl: 11   levocetirizine (XYZAL ALLERGY 24HR) 5 MG tablet, Take 2 tablets (10 mg total) by mouth every evening. (Patient not taking: Reported on 03/25/2023), Disp: 60 tablet, Rfl: 3   ondansetron (ZOFRAN-ODT) 8 MG disintegrating tablet, Take 1 tablet (8 mg total) by mouth every 8 (eight) hours as needed for nausea or vomiting. (Patient not taking: Reported on 12/17/2022), Disp: 8 tablet, Rfl: 0   terconazole (TERAZOL 7) 0.4 % vaginal cream, Place 1 applicator vaginally at bedtime. (Patient not taking: Reported on 05/13/2023), Disp: 45 g, Rfl: 0   triamcinolone cream (KENALOG) 0.1 %, Apply 1 Application topically 2 (two) times daily. (Patient not taking: Reported on 12/17/2022), Disp: , Rfl:    Upadacitinib ER (RINVOQ) 15 MG TB24, Take 1 tablet every day by oral route. (Patient not taking: Reported on 06/19/2023), Disp: , Rfl:   Objective Blood pressure 135/87, pulse (!) 118, height 5\' 3"  (1.6 m), weight 126 lb (57.2 kg).  ***  Pertinent ROS ***  Labs or studies ***    Impression + Management Plan: Diagnoses this Encounter:: No  diagnosis found.    Medications prescribed during  this encounter: No orders of the defined types were placed in this encounter.   Labs or Scans Ordered during this encounter: No orders of the defined types were placed in this encounter.     Follow up No follow-ups on file.

## 2023-06-25 LAB — CYTOLOGY - PAP
Comment: NEGATIVE
Diagnosis: UNDETERMINED — AB
High risk HPV: NEGATIVE

## 2023-06-30 ENCOUNTER — Encounter: Payer: Self-pay | Admitting: Obstetrics & Gynecology

## 2023-07-03 ENCOUNTER — Other Ambulatory Visit: Payer: Self-pay | Admitting: Adult Health

## 2023-07-03 MED ORDER — FLUCONAZOLE 150 MG PO TABS: ORAL_TABLET | ORAL | 1 refills | Status: DC

## 2023-07-03 NOTE — Progress Notes (Signed)
Rx diflucan.  

## 2023-09-05 ENCOUNTER — Ambulatory Visit (INDEPENDENT_AMBULATORY_CARE_PROVIDER_SITE_OTHER): Payer: Medicaid Other | Admitting: Women's Health

## 2023-09-05 ENCOUNTER — Other Ambulatory Visit (HOSPITAL_COMMUNITY)
Admission: RE | Admit: 2023-09-05 | Discharge: 2023-09-05 | Disposition: A | Discharge status: Home / Self Care | Payer: Medicaid Other | Source: Ambulatory Visit | Attending: Women's Health | Admitting: Women's Health

## 2023-09-05 ENCOUNTER — Encounter: Payer: Self-pay | Admitting: Women's Health

## 2023-09-05 VITALS — BP 127/81 | HR 99 | Ht 63.0 in | Wt 129.0 lb

## 2023-09-05 DIAGNOSIS — N898 Other specified noninflammatory disorders of vagina: Secondary | ICD-10-CM | POA: Diagnosis present

## 2023-09-05 DIAGNOSIS — Z8742 Personal history of other diseases of the female genital tract: Secondary | ICD-10-CM

## 2023-09-05 DIAGNOSIS — B081 Molluscum contagiosum: Secondary | ICD-10-CM | POA: Diagnosis not present

## 2023-09-05 DIAGNOSIS — N920 Excessive and frequent menstruation with regular cycle: Secondary | ICD-10-CM | POA: Diagnosis not present

## 2023-09-05 NOTE — Progress Notes (Signed)
 GYN VISIT Patient name: Joyce Wilson MRN [id number]  Date of birth: 27-May-1996 Chief Complaint:   Vaginal Discharge (spotting)  History of Present Illness:   Joyce Wilson is a 28 y.o. G68P2002 Caucasian female being seen today for report of occ brown spotting, no cramping.  Bumps all over mons/vulva-itching and spreading for about a year, Dr. Jayne said was molloscum, doesn't like them and wants them gone. Feels like she always has thick clumpy white vaginal d/c, no itching/irritation/bad odor.  Has tried yeast cream, didn't really help.    No LMP recorded. (Menstrual status: IUD). The current method of family planning is IUD.  Last pap 06/19/23. Results were: ASCUS w/ HRHPV negative     10/29/2022   11:14 AM 03/03/2020   12:16 PM 03/03/2020   11:58 AM 05/25/2019   10:43 AM  Depression screen PHQ 2/9  Decreased Interest 0 0 0 0  Down, Depressed, Hopeless 0 1 1 0  PHQ - 2 Score 0 1 1 0  Altered sleeping 1  1 1   Tired, decreased energy 1  1 1   Change in appetite 0  0 0  Feeling bad or failure about yourself  0  0 0  Trouble concentrating 0  1 0  Moving slowly or fidgety/restless 0  0 0  Suicidal thoughts 0  0 0  PHQ-9 Score 2  4 2   Difficult doing work/chores   Not difficult at all Not difficult at all        10/29/2022   11:14 AM 03/03/2020   11:59 AM  GAD 7 : Generalized Anxiety Score  Nervous, Anxious, on Edge 2 3  Control/stop worrying 2 1  Worry too much - different things 2 3  Trouble relaxing 1 1  Restless 0 1  Easily annoyed or irritable 1 1  Afraid - awful might happen 0 1  Total GAD 7 Score 8 11  Anxiety Difficulty  Not difficult at all     Review of Systems:   Pertinent items are noted in HPI Denies fever/chills, dizziness, headaches, visual disturbances, fatigue, shortness of breath, chest pain, abdominal pain, vomiting, abnormal vaginal discharge/itching/odor/irritation, problems with periods, bowel movements, urination, or intercourse unless otherwise stated  above.  Pertinent History Reviewed:  Reviewed past medical,surgical, social, obstetrical and family history.  Reviewed problem list, medications and allergies. Physical Assessment:   Vitals:   09/05/23 1136  BP: 127/81  Pulse: 99  Weight: 129 lb (58.5 kg)  Height: 5' 3 (1.6 m)  Body mass index is 22.85 kg/m.       Physical Examination:   General appearance: alert, well appearing, and in no distress  Mental status: alert, oriented to person, place, and time  Skin: warm & dry   Cardiovascular: normal heart rate noted  Respiratory: normal respiratory effort, no distress  Abdomen: soft, non-tender   Pelvic: multiple small raised bumps w/ white center c/w MC entire mons/labia/inner thighs; spec exam: IUD string barely visible w/in os, small amt brownish d/c, no obvious clumpy white d/c she was describing, CV swab obtained  Extremities: no edema   Chaperone: Peggy Dones    No results found for this or any previous visit (from the past 24 hours).  Assessment & Plan:  1) Spotting w/ IUD> CV swab  2) Vaginal d/c> CV swab, feels this is a recurrent issue- can try Uro vaginal probiotic  3) Molluscum contagiosum> mons/labia/inner thighs, per UTD cryotherapy may be best option, pt states her PCP can do. If not,  check w/ dermatology. 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   No orders of the defined types were placed in this encounter.   Return for pap after 5/20.  Suzen JONELLE Fetters CNM, [facility] 09/05/2023 12:14 PM

## 2023-09-05 NOTE — Patient Instructions (Addendum)
 Uro vaginal probiotic  Molluscum Contagiosum Rash in Adults: What to Know Molluscum contagiosum is a skin infection that causes a rash. The rash often goes away on its own. But it can also be treated with a procedure or medicine. What are the causes? Molluscum contagiosum is caused by a germ called a virus. The germ is contagious. This means it can spread from person to person. It can spread if you: Touch the skin of an infected person. Touch things that have the germs on them. These include towels or clothes. Have sex. What increases the risk? The rash is more common in young children. Adults may be more likely to get the rash if: They live somewhere that's moist and warm. Their body has a weak defense system (immune system). What are the signs or symptoms? The main symptom is a painless rash that shows up 2-7 weeks after you're exposed to the germ. The rash is made up of small, dome-shaped bumps. The bumps may: Be on your genitals, thighs, face, neck, or belly. Be pink or the color of your skin. Show up one by one or in groups. Range from the size of a pinhead to the size of a pencil eraser. Feel firm, smooth, and waxy. Have a pit in the middle. Itch. This is rare. How is this diagnosed? Molluscum contagiosum may be diagnosed based on your symptoms, medical history, and an exam. Your health care provider may scrape the bumps to get a skin sample. This sample will be taken for testing. How is this treated? In most cases, the rash goes away on its own in 2 months. In some cases, it can take 6-12 months. Treatment may not be needed. But it may be used to: Keep the germs from spreading to other people. Stop the rash from spreading to other parts of your body. Help you feel better if you're worried or stressed about how the rash looks. Treatment may include: Putting liquid nitrogen on your skin to freeze off the bumps. Doing a procedure to scrape off the bumps. Taking off the bumps with  a laser. Putting medicine on the bumps. Follow these instructions at home: Take or apply your medicines only as told. Do not scratch or pick at the bumps. Scratching or picking can cause the rash to spread to other parts of your body. How is this prevented? As long as you have bumps on your skin, the infection can spread to others. To prevent this: Do not share clothes or towels with others. Do not use a public pool, sauna, or shower until the bumps go away. Avoid close contact with others until the bumps go away. This includes sex. Wash your hands often with soap and water . If you can't use soap and water , use hand sanitizer. Cover the bumps with clothes or a bandage when you're near people. Contact a health care provider if: The bumps spread. The bumps turn red and sore. The bumps haven't gone away after 12 months. This information is not intended to replace advice given to you by your health care provider. Make sure you discuss any questions you have with your health care provider. Document Revised: 01/08/2023 Document Reviewed: 01/08/2023 Elsevier Patient Education  2024 [employer].

## 2023-09-06 LAB — CERVICOVAGINAL ANCILLARY ONLY
Bacterial Vaginitis (gardnerella): NEGATIVE
Candida Glabrata: NEGATIVE
Candida Vaginitis: NEGATIVE
Chlamydia: NEGATIVE
Comment: NEGATIVE
Comment: NEGATIVE
Comment: NEGATIVE
Comment: NEGATIVE
Comment: NEGATIVE
Comment: NORMAL
Neisseria Gonorrhea: NEGATIVE
Trichomonas: NEGATIVE

## 2023-09-09 ENCOUNTER — Ambulatory Visit: Payer: Medicaid Other | Admitting: Women's Health

## 2023-09-09 ENCOUNTER — Encounter: Payer: Self-pay | Admitting: Women's Health

## 2023-11-18 ENCOUNTER — Ambulatory Visit: Payer: Medicaid Other | Admitting: Women's Health

## 2023-12-18 ENCOUNTER — Encounter: Payer: Self-pay | Admitting: Adult Health

## 2023-12-18 ENCOUNTER — Other Ambulatory Visit (HOSPITAL_COMMUNITY)
Admission: RE | Admit: 2023-12-18 | Discharge: 2023-12-18 | Disposition: A | Discharge status: Home / Self Care | Source: Ambulatory Visit | Attending: Adult Health | Admitting: Adult Health

## 2023-12-18 ENCOUNTER — Ambulatory Visit: Payer: Medicaid Other | Admitting: Adult Health

## 2023-12-18 VITALS — BP 122/83 | HR 96 | Ht 63.0 in | Wt 127.0 lb

## 2023-12-18 DIAGNOSIS — Z3202 Encounter for pregnancy test, result negative: Secondary | ICD-10-CM | POA: Diagnosis not present

## 2023-12-18 DIAGNOSIS — R87610 Atypical squamous cells of undetermined significance on cytologic smear of cervix (ASC-US): Secondary | ICD-10-CM | POA: Insufficient documentation

## 2023-12-18 DIAGNOSIS — R87613 High grade squamous intraepithelial lesion on cytologic smear of cervix (HGSIL): Secondary | ICD-10-CM | POA: Diagnosis not present

## 2023-12-18 DIAGNOSIS — Z9889 Other specified postprocedural states: Secondary | ICD-10-CM

## 2023-12-18 DIAGNOSIS — Z01419 Encounter for gynecological examination (general) (routine) without abnormal findings: Secondary | ICD-10-CM

## 2023-12-18 DIAGNOSIS — F419 Anxiety disorder, unspecified: Secondary | ICD-10-CM

## 2023-12-18 DIAGNOSIS — B081 Molluscum contagiosum: Secondary | ICD-10-CM

## 2023-12-18 DIAGNOSIS — T8332XA Displacement of intrauterine contraceptive device, initial encounter: Secondary | ICD-10-CM

## 2023-12-18 DIAGNOSIS — N898 Other specified noninflammatory disorders of vagina: Secondary | ICD-10-CM

## 2023-12-18 LAB — POCT URINE PREGNANCY: Preg Test, Ur: NEGATIVE

## 2023-12-18 MED ORDER — ESCITALOPRAM OXALATE 10 MG PO TABS: 10.0000 mg | ORAL_TABLET | Freq: Every day | ORAL | 2 refills | Status: DC

## 2023-12-18 NOTE — Progress Notes (Signed)
 Patient ID: Joyce Wilson, female   DOB: 10/01/1995, 28 y.o.   MRN: [id number] History of Present Illness: Joyce Wilson is a 28 year old white female, single, G2P2002, in for a well woman gyn exam and pap. She is sp laser ablation of cervix in May 2024, paps are every  6 monhts. She has an IUD and has  had dark spotting.     Component Value Date/Time   DIAGPAP (A) 06/19/2023 1647    - Atypical squamous cells of undetermined significance (ASC-US )   DIAGPAP (A) 10/29/2022 1110    - High grade squamous intraepithelial lesion (HSIL)   DIAGPAP  05/25/2019 1040    - Negative for intraepithelial lesion or malignancy (NILM)   HPVHIGH Negative 06/19/2023 1647   HPVHIGH Positive (A) 10/29/2022 1110   ADEQPAP  06/19/2023 1647    Satisfactory for evaluation; transformation zone component PRESENT.   ADEQPAP  10/29/2022 1110    Satisfactory for evaluation; transformation zone component PRESENT.   ADEQPAP  05/25/2019 1040    Satisfactory for evaluation; transformation zone component PRESENT.  PCP is Dr Arlene Ben   Current Medications, Allergies, Past Medical History, Past Surgical History, Family History and Social History were reviewed in [employer] electronic medical record.     Review of Systems: Patient denies any headaches, hearing loss, fatigue, blurred vision, shortness of breath, chest pain, abdominal pain, problems with bowel movements, urination, or intercourse. No joint pain or mood swings.  Spotting dark with IUD Has had clumpy discharge since cervical ablation  Physical Exam:BP 122/83 (BP Location: Right Arm, Patient Position: Sitting, Cuff Size: Normal)   Pulse 96   Ht 5\' 3"  (1.6 m)   Wt 127 lb (57.6 kg)   LMP 12/04/2023 (Approximate)   BMI 22.50 kg/m  UPT is negative  General:  Well developed, well nourished, no acute distress Skin:  Warm and dry Neck:  Midline trachea, normal thyroid, good ROM, no lymphadenopathy Lungs; Clear to auscultation bilaterally Breast:  No dominant  palpable mass, retraction, or nipple discharge Cardiovascular: Regular rate and rhythm Abdomen:  Soft, non tender, no hepatosplenomegaly Pelvic:  External genitalia is normal in appearance, + molluscum contagiosum, in groin area.  The vagina is normal in appearance, has clumpy discharge, no odor. Urethra has no lesions or masses. The cervix is bulbous, pap with GC/CGL,trich and  HR HPV genotyping performed, no IUD strings seen.  Uterus is felt to be normal size, shape, and contour.  No adnexal masses or tenderness noted.Bladder is non tender, no masses felt. Extremities/musculoskeletal:  No swelling or varicosities noted, no clubbing or cyanosis Psych:  No mood changes, alert and cooperative,seems happy AA is 3 Fall risk is low    12/18/2023   10:43 AM 10/29/2022   11:14 AM 03/03/2020   12:16 PM  Depression screen PHQ 2/9  Decreased Interest 0 0 0  Down, Depressed, Hopeless 0 0 1  PHQ - 2 Score 0 0 1  Altered sleeping 0 1   Tired, decreased energy 2 1   Change in appetite 0 0   Feeling bad or failure about yourself  0 0   Trouble concentrating 1 0   Moving slowly or fidgety/restless 0 0   Suicidal thoughts 0 0   PHQ-9 Score 3 2        12/18/2023   10:44 AM 10/29/2022   11:14 AM 03/03/2020   11:59 AM  GAD 7 : Generalized Anxiety Score  Nervous, Anxious, on Edge 3 2 3   Control/stop worrying 3 2  1  Worry too much - different things 3 2 3   Trouble relaxing 2 1 1   Restless 1 0 1  Easily annoyed or irritable 1 1 1   Afraid - awful might happen 1 0 1  Total GAD 7 Score 14 8 11   Anxiety Difficulty   Not difficult at all      Upstream - 12/18/23 1051       Pregnancy Intention Screening   Does the patient want to become pregnant in the next year? No    Does the patient's partner want to become pregnant in the next year? No    Would the patient like to discuss contraceptive options today? Yes      Contraception Wrap Up   Current Method IUD or IUS    End Method IUD or IUS     Contraception Counseling Provided Yes             Examination chaperoned by Alphonso Aschoff LPN  Impression and plan: 1. Atypical squamous cells of undetermined significance on cytologic smear of cervix (ASC-US ) Pap sent - Cytology - PAP( [place])  2. Encounter for gynecological examination with Papanicolaou smear of cervix (Primary) Pap sent Pap in 6 months Physical in 1 year  3. Molluscum contagiosum Do not shave over them, has cream from dermatologist   4. High grade squamous intraepithelial cervical dysplasia Pap sent   5. History of conization of cervix for high grade dysplasia of the cervix Pap sent Pap in 6 months   6. Intrauterine contraceptive device threads lost, initial encounter No strings seen UPT negative  Return in about 3 weeks for US , and use condoms if has sex  - POCT urine pregnancy - US  PELVIC COMPLETE WITH TRANSVAGINAL; Future  7. Pregnancy examination or test, negative result - POCT urine pregnancy  8. Vaginal discharge +clumpy discharge, no odor  9. Anxiety Will rx lexapro 10 mg 1 daily  Meds ordered this encounter  Medications   escitalopram (LEXAPRO) 10 MG tablet    Sig: Take 1 tablet (10 mg total) by mouth daily.    Dispense:  30 tablet    Refill:  2    Supervising Provider:   Evalyn Hillier H [[id number]]   Follow up in 8 weeks can be telehealth

## 2023-12-20 LAB — CYTOLOGY - PAP
Chlamydia: NEGATIVE
Comment: NEGATIVE
Comment: NEGATIVE
Comment: NEGATIVE
Comment: NORMAL
Diagnosis: NEGATIVE
High risk HPV: NEGATIVE
Neisseria Gonorrhea: NEGATIVE
Trichomonas: NEGATIVE

## 2023-12-24 ENCOUNTER — Ambulatory Visit: Payer: Self-pay | Admitting: Adult Health

## 2024-01-06 ENCOUNTER — Other Ambulatory Visit

## 2024-01-08 ENCOUNTER — Other Ambulatory Visit: Admitting: Radiology

## 2024-01-15 ENCOUNTER — Ambulatory Visit: Admitting: Radiology

## 2024-01-15 DIAGNOSIS — T8332XA Displacement of intrauterine contraceptive device, initial encounter: Secondary | ICD-10-CM | POA: Diagnosis not present

## 2024-01-15 NOTE — Progress Notes (Signed)
 GYN US : TA and TV imaging performed - vinyl probe cover used - Chaperone: Emma Anteverted uterus normal in size, symmetrical, homogeneous myometrium, no focal abn seen Endom thickness = 2.7 mm, IUD seen in normal position upper mid cavity with arms out.  uniform avascular cavity and canal, no evidence of soft tissue intracavitary defects.   The IUD string is seen ~ 2 cm superior to external os Ovaries appear normal in size,  The ovaries appear mobile, neg adnexal regions,  neg CDS, no free fluid present

## 2024-02-12 ENCOUNTER — Ambulatory Visit: Admitting: Adult Health

## 2024-02-12 ENCOUNTER — Encounter: Payer: Self-pay | Admitting: Adult Health

## 2024-02-12 DIAGNOSIS — Z1331 Encounter for screening for depression: Secondary | ICD-10-CM

## 2024-02-12 DIAGNOSIS — F419 Anxiety disorder, unspecified: Secondary | ICD-10-CM | POA: Diagnosis not present

## 2024-02-12 MED ORDER — PAROXETINE HCL 10 MG PO TABS: 10.0000 mg | ORAL_TABLET | Freq: Every day | ORAL | 2 refills | Status: DC

## 2024-02-12 NOTE — Progress Notes (Signed)
 Patient ID: Joyce Wilson, female   DOB: Feb 27, 1996, 28 y.o.   MRN: [id number]   TELEHEALTH GYNECOLOGY VISIT ENCOUNTER NOTE  [facility]   Patient location: Home  I connected with Joyce Wilson on 02/12/24 at 10:50 AM EDT by telephone and verified that I am speaking with the correct person using two identifiers. Patient was unable to do MyChart audiovisual encounter due to technical difficulties, she tried several times.    I discussed the limitations, risks, security and privacy concerns of performing an evaluation and management service by telephone and the availability of in person appointments. I also discussed with the patient that there may be a patient responsible charge related to this service. The patient expressed understanding and agreed to proceed.   History:  Joyce Wilson is a 28 y.o. [phone] female being evaluated today for anxiety and it is not better on lexapro  so stopped last week, felt tired all the time, and worries about everything.  She denies any other concerns.       Past Medical History:  Diagnosis Date   Anxiety    Burning with urination 02/01/2015   Chicken pox    Chlamydia infection    Dyspareunia    Hematuria 02/01/2015   Medical history non-contributory    Screening for STD (sexually transmitted disease) 02/01/2015   Vaginal irritation 02/01/2015   Vaginal Pap smear, abnormal    Past Surgical History:  Procedure Laterality Date   CERVICAL ABLATION N/A 12/05/2022   Procedure: CERVICAL ABLATION;  Surgeon: Jayne Vonn DEL, MD;  Location: [facility];  Service: Gynecology;  Laterality: N/A;   NO PAST SURGERIES     The following portions of the patient's history were reviewed and updated as appropriate: allergies, current medications, past family history, past medical history, past social history, past surgical history and problem list.   Health Maintenance:  Normal pap and negative HRHPV on 12/18/23  Review of  Systems:  Pertinent items noted in HPI and remainder of comprehensive ROS otherwise negative.  Physical Exam:   General:  Alert, oriented and cooperative.   Mental Status: Normal mood and affect perceived. Normal judgment and thought content.  Physical exam deferred due to nature of the encounter    02/12/2024   11:12 AM 12/18/2023   10:43 AM 10/29/2022   11:14 AM  Depression screen PHQ 2/9  Decreased Interest 0 0 0  Down, Depressed, Hopeless 0 0 0  PHQ - 2 Score 0 0 0  Altered sleeping 1 0 1  Tired, decreased energy 1 2 1   Change in appetite 0 0 0  Feeling bad or failure about yourself  0 0 0  Trouble concentrating 1 1 0  Moving slowly or fidgety/restless 0 0 0  Suicidal thoughts 0 0 0  PHQ-9 Score 3 3 2        02/12/2024   11:13 AM 12/18/2023   10:44 AM 10/29/2022   11:14 AM 03/03/2020   11:59 AM  GAD 7 : Generalized Anxiety Score  Nervous, Anxious, on Edge 3 3 2 3   Control/stop worrying 3 3 2 1   Worry too much - different things 3 3 2 3   Trouble relaxing 1 2 1 1   Restless 1 1 0 1  Easily annoyed or irritable 1 1 1 1   Afraid - awful might happen 0 1 0 1  Total GAD 7 Score 12 14 8 11   Anxiety Difficulty    Not difficult at all  Upstream - 02/12/24 1111       Pregnancy Intention Screening   Does the patient want to become pregnant in the next year? No    Does the patient's partner want to become pregnant in the next year? No    Would the patient like to discuss contraceptive options today? No      Contraception Wrap Up   Current Method IUD or IUS    End Method IUD or IUS    Contraception Counseling Provided Yes          Labs and Imaging No results found for this or any previous visit (from the past 2 weeks). US  PELVIC COMPLETE WITH TRANSVAGINAL Result Date: 01/16/2024 Images from the original result were not included.  ..an [employer] of Ultrasound Medicine Technical sales engineer) accredited practice [facility] [address]  [place] [address] Ordering Provider: Signa Delon LABOR, NP         GYNECOLOGIC SONOGRAM Joyce Wilson is a 28 y.o. [id number] Patient's last menstrual period was 12/04/2023 (approximate). for a pelvic sonogram for IUD position. Uterus                      4.7 x 3.3 x 5.1 cm, Total uterine volume 42.3 cc Endometrium          2.7 mm, symmetrical, IUD seen in normal position upper mid cavity with arms out.  uniform avascular cavity and canal, no evidence of soft tissue intracavitary defects.  The IUD string is seen ~ 2 cm superior to external os Right ovary             3.6 x 1.8 x 3.0 cm,  mobile Left ovary                2.2 x 1.8 x 1.8 cm, mobile neg CDS, no free fluid present Technician Comments: GYN US : TA and TV imaging performed - vinyl probe cover used - Chaperone: Emma Anteverted uterus normal in size, symmetrical, homogeneous myometrium, no focal abn seen Endom thickness = 2.7 mm, IUD seen in normal position upper mid cavity with arms out.  uniform avascular cavity and canal, no evidence of soft tissue intracavitary defects.  The IUD string is seen ~ 2 cm superior to external os Ovaries appear normal in size,  The ovaries appear mobile, neg adnexal regions, neg CDS, no free fluid present Joyce Wilson Fair 01/15/2024 9:41 AM Clinical Impression and recommendations: I have reviewed the sonogram results above, combined with the patient's current clinical course, below are my impressions and any appropriate recommendations for management based on the sonographic findings. Normal uterine size and shape Normal endometrium, IUD appears centrally located Normal ovaries bilaterally No abnormalities noted.  IUD appears in proper position. Garold Sheeler Ozan, DO Attending Obstetrician & Gynecologist, [facility], [facility]      Assessment and Plan:     1. Anxiety (Primary) Will try paxil  10 mg 1 daily and refer to [facility] Follow up in 6 weeks for ROS, can be teleheath  -  Ambulatory referral to Behavioral Health       I discussed the assessment and treatment plan with the patient. The patient was provided an opportunity to ask questions and all were answered. The patient agreed with the plan and demonstrated an understanding of the instructions.   The patient was advised to call back or seek an in-person evaluation/go to the ED if the symptoms  worsen or if the condition fails to improve as anticipated.  I provided 10 minutes of non-face-to-face time during this encounter.   Delon Lewis, NP [facility] for [employer], [facility]

## 2024-02-25 ENCOUNTER — Other Ambulatory Visit: Payer: Self-pay | Admitting: Adult Health

## 2024-02-25 MED ORDER — BUPROPION HCL ER (XL) 150 MG PO TB24: 150.0000 mg | ORAL_TABLET | Freq: Every day | ORAL | 3 refills | Status: DC

## 2024-02-25 NOTE — Progress Notes (Signed)
 Will rx Wellbutrin  XL 150 mg 1 daily

## 2024-03-11 ENCOUNTER — Other Ambulatory Visit: Payer: Self-pay | Admitting: Medical Genetics

## 2024-03-25 ENCOUNTER — Encounter: Payer: Self-pay | Admitting: Adult Health

## 2024-03-25 ENCOUNTER — Ambulatory Visit: Admitting: Adult Health

## 2024-03-25 VITALS — Ht 63.0 in | Wt 125.0 lb

## 2024-03-25 DIAGNOSIS — F419 Anxiety disorder, unspecified: Secondary | ICD-10-CM | POA: Diagnosis not present

## 2024-03-25 DIAGNOSIS — Z1331 Encounter for screening for depression: Secondary | ICD-10-CM

## 2024-03-25 MED ORDER — BUPROPION HCL ER (XL) 300 MG PO TB24: 300.0000 mg | ORAL_TABLET | ORAL | 3 refills | Status: DC

## 2024-03-25 NOTE — Progress Notes (Signed)
 Patient ID: Joyce Wilson, female   DOB: [birth year], 28 y.o.   MRN: [id number]   TELEHEALTH GYNECOLOGY VISIT ENCOUNTER NOTE  [facility]: [facility]   Patient location: work  I connected with Joyce Wilson on 03/25/24 at 10:50 AM EDT by telephone and verified that I am speaking with the correct person using two identifiers. Patient was unable to do MyChart audiovisual encounter due to technical difficulties, she tried several times.    I discussed the limitations, risks, security and privacy concerns of performing an evaluation and management service by telephone and the availability of in person appointments. I also discussed with the patient that there may be a patient responsible charge related to this service. The patient expressed understanding and agreed to proceed.   History:  Kerianne Wilson is a 28 y.o. [phone] female being evaluated today for anxiety, after starting Wellbutrin , and moods are better, not as irritable, still anxious at times. She denies any depression or other concerns.       Past Medical History:  Diagnosis Date   Anxiety    Burning with urination 02/01/2015   Chicken pox    Chlamydia infection    Dyspareunia    Hematuria 02/01/2015   Medical history non-contributory    Screening for STD (sexually transmitted disease) 02/01/2015   Vaginal irritation 02/01/2015   Vaginal Pap smear, abnormal    Past Surgical History:  Procedure Laterality Date   CERVICAL ABLATION N/A 12/05/2022   Procedure: CERVICAL ABLATION;  Surgeon: Jayne Vonn DEL, MD;  Location: [facility];  Service: Gynecology;  Laterality: N/A;   NO PAST SURGERIES     The following portions of the patient's history were reviewed and updated as appropriate: allergies, current medications, past family history, past medical history, past social history, past surgical history and problem list.   Health Maintenance:  Normal pap and negative HRHPV on 12/18/23   Review of  Systems:  Pertinent items noted in HPI and remainder of comprehensive ROS otherwise negative.  Physical Exam:   General:  Alert, oriented and cooperative.   Mental Status: Normal mood and affect perceived. Normal judgment and thought content.  Physical exam deferred due to nature of the encounter Ht 5' 3 (1.6 m)   Wt 125 lb (56.7 kg)   BMI 22.14 kg/m      03/25/2024   11:07 AM 02/12/2024   11:12 AM 12/18/2023   10:43 AM  Depression screen PHQ 2/9  Decreased Interest 0 0 0  Down, Depressed, Hopeless 0 0 0  PHQ - 2 Score 0 0 0  Altered sleeping 0 1 0  Tired, decreased energy 1 1 2   Change in appetite 0 0 0  Feeling bad or failure about yourself  0 0 0  Trouble concentrating 0 1 1  Moving slowly or fidgety/restless 0 0 0  Suicidal thoughts 0 0 0  PHQ-9 Score 1 3 3        03/25/2024   11:09 AM 02/12/2024   11:13 AM 12/18/2023   10:44 AM 10/29/2022   11:14 AM  GAD 7 : Generalized Anxiety Score  Nervous, Anxious, on Edge 1 3 3 2   Control/stop worrying 1 3 3 2   Worry too much - different things 1 3 3 2   Trouble relaxing 0 1 2 1   Restless 0 1 1 0  Easily annoyed or irritable 1 1 1 1   Afraid - awful might happen 1 0 1 0  Total GAD 7 Score 5 12  14 8      Upstream - 03/25/24 1107       Pregnancy Intention Screening   Does the patient want to become pregnant in the next year? No    Does the patient's partner want to become pregnant in the next year? No    Would the patient like to discuss contraceptive options today? No      Contraception Wrap Up   Current Method IUD or IUS    End Method IUD or IUS    Contraception Counseling Provided Yes          Labs and Imaging No results found for this or any previous visit (from the past 2 weeks). No results found.    Assessment and Plan:     1. Anxiety (Primary) Moods better, still anxious, will increase Wellbutrin  XL to 300 mg  Meds ordered this encounter  Medications   buPROPion  (WELLBUTRIN  XL) 300 MG 24 hr tablet    Sig:  Take 1 tablet (300 mg total) by mouth every morning.    Dispense:  30 tablet    Refill:  3    Supervising Provider:   JAYNE MINDER H [[id number]]   Follow up in 3 months for ROS can be telehealth if needed       I discussed the assessment and treatment plan with the patient. The patient was provided an opportunity to ask questions and all were answered. The patient agreed with the plan and demonstrated an understanding of the instructions.   The patient was advised to call back or seek an in-person evaluation/go to the ED if the symptoms worsen or if the condition fails to improve as anticipated.  I provided 10 minutes of non-face-to-face time during this encounter.   Delon Lewis, NP [facility] [employer], [facility]

## 2024-04-28 ENCOUNTER — Ambulatory Visit: Payer: Medicaid Other | Admitting: Dermatology

## 2024-05-08 ENCOUNTER — Other Ambulatory Visit: Payer: Self-pay | Admitting: Medical Genetics

## 2024-05-08 DIAGNOSIS — Z006 Encounter for examination for normal comparison and control in clinical research program: Secondary | ICD-10-CM

## 2024-06-19 ENCOUNTER — Encounter: Payer: Self-pay | Admitting: Adult Health

## 2024-06-19 ENCOUNTER — Other Ambulatory Visit (HOSPITAL_COMMUNITY)
Admission: RE | Admit: 2024-06-19 | Discharge: 2024-06-19 | Disposition: A | Source: Ambulatory Visit | Attending: Adult Health | Admitting: Adult Health

## 2024-06-19 ENCOUNTER — Ambulatory Visit: Admitting: Adult Health

## 2024-06-19 VITALS — BP 117/81 | HR 93 | Ht 63.0 in | Wt 130.0 lb

## 2024-06-19 DIAGNOSIS — R87613 High grade squamous intraepithelial lesion on cytologic smear of cervix (HGSIL): Secondary | ICD-10-CM | POA: Diagnosis not present

## 2024-06-19 DIAGNOSIS — F419 Anxiety disorder, unspecified: Secondary | ICD-10-CM | POA: Diagnosis not present

## 2024-06-19 DIAGNOSIS — Z1331 Encounter for screening for depression: Secondary | ICD-10-CM | POA: Diagnosis not present

## 2024-06-19 DIAGNOSIS — R87610 Atypical squamous cells of undetermined significance on cytologic smear of cervix (ASC-US): Secondary | ICD-10-CM | POA: Diagnosis present

## 2024-06-19 DIAGNOSIS — Z975 Presence of (intrauterine) contraceptive device: Secondary | ICD-10-CM | POA: Diagnosis not present

## 2024-06-19 MED ORDER — BUPROPION HCL ER (XL) 150 MG PO TB24: 150.0000 mg | ORAL_TABLET | Freq: Every day | ORAL | 1 refills | Status: AC

## 2024-06-19 NOTE — Progress Notes (Signed)
 Subjective:     Patient ID: Joyce Wilson, female   DOB: July 22, 1996, 28 y.o.   MRN: [id number]  HPI Joyce Wilson is 28 year old white female,single, G2P2002 in for 6 month pap, has laser ablation of cervix 2024 Last pap was 12/18/23 NILM, negative HPV  PCP is Dr Katrinka  Review of Systems Had sweating with Wellbutrin  XL 300 so stopped it +anxiety Reviewed past medical,surgical, social and family history. Reviewed medications and allergies.     Objective:   Physical Exam BP 117/81 (BP Location: Right Arm, Patient Position: Sitting, Cuff Size: Normal)   Pulse 93   Ht 5' 3 (1.6 m)   Wt 130 lb (59 kg)   BMI 23.03 kg/m     Skin warm and dry.Pelvic: external genitalia is normal in appearance no lesions, vagina: pink,urethra has no lesions or masses noted, cervix:smooth and bulbous,+short IUD string at os, pap with HR HPV genotyping performed, uterus: normal size, shape and contour, non tender, no masses felt, adnexa: no masses or tenderness noted. Bladder is non tender and no masses felt.  Fall risk is low    06/19/2024   11:14 AM 03/25/2024   11:07 AM 02/12/2024   11:12 AM  Depression screen PHQ 2/9  Decreased Interest 0 0 0  Down, Depressed, Hopeless 0 0 0  PHQ - 2 Score 0 0 0  Altered sleeping 0 0 1  Tired, decreased energy 1 1 1   Change in appetite 0 0 0  Feeling bad or failure about yourself  0 0 0  Trouble concentrating 1 0 1  Moving slowly or fidgety/restless 0 0 0  Suicidal thoughts 0 0 0  PHQ-9 Score 2 1  3       Data saved with a previous flowsheet row definition       06/19/2024   11:16 AM 03/25/2024   11:09 AM 02/12/2024   11:13 AM 12/18/2023   10:44 AM  GAD 7 : Generalized Anxiety Score  Nervous, Anxious, on Edge 3 1 3 3   Control/stop worrying 3 1 3 3   Worry too much - different things 2 1 3 3   Trouble relaxing 1 0 1 2  Restless 1 0 1 1  Easily annoyed or irritable 1 1 1 1   Afraid - awful might happen 1 1 0 1  Total GAD 7 Score 12 5 12 14     Upstream - 06/19/24  1122       Pregnancy Intention Screening   Does the patient want to become pregnant in the next year? No    Does the patient's partner want to become pregnant in the next year? No    Would the patient like to discuss contraceptive options today? No      Contraception Wrap Up   Current Method IUD or IUS    End Method IUD or IUS    Contraception Counseling Provided Yes           Examination chaperoned by Joyce Salt LPN  Assessment:     1. Atypical squamous cells of undetermined significance on cytologic smear of cervix (ASC-US )  Pap sent Pap in 6 months   - Cytology - PAP( [place])  2. 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 Anxiety +anxiety will rx Wellbutrin  XL 150 mg 1 daily Meds ordered this encounter  Medications   buPROPion  (WELLBUTRIN  XL) 150 MG 24 hr  tablet    Sig: Take 1 tablet (150 mg total) by mouth daily.    Dispense:  90 tablet    Refill:  1    Supervising Provider:   JAYNE VONN DEL [[id number]]   Message me in about 6 weeks for follow up  4. IUD (intrauterine device) in place L[place]  placed 01/04/20    Plan:     Return in 6 months for pap and physical

## 2024-06-23 ENCOUNTER — Ambulatory Visit: Payer: Self-pay | Admitting: Adult Health

## 2024-06-23 LAB — CYTOLOGY - PAP
Comment: NEGATIVE
Diagnosis: NEGATIVE
High risk HPV: NEGATIVE

## 2024-07-13 LAB — GENECONNECT MOLECULAR SCREEN: Genetic Analysis Overall Interpretation: NEGATIVE
